# Patient Record
Sex: Female | Born: 1970 | Race: Black or African American | Hispanic: No | Smoking: Current every day smoker
Health system: Southern US, Community
[De-identification: ages and names within clinical notes are randomized; demographics above are authoritative.]

## PROBLEM LIST (undated history)

## (undated) DIAGNOSIS — G459 Transient cerebral ischemic attack, unspecified: Secondary | ICD-10-CM

## (undated) DIAGNOSIS — R7303 Prediabetes: Secondary | ICD-10-CM

## (undated) DIAGNOSIS — I10 Essential (primary) hypertension: Secondary | ICD-10-CM

## (undated) DIAGNOSIS — G43909 Migraine, unspecified, not intractable, without status migrainosus: Secondary | ICD-10-CM

## (undated) DIAGNOSIS — M797 Fibromyalgia: Secondary | ICD-10-CM

## (undated) HISTORY — PX: ABDOMINAL HYSTERECTOMY: SHX81

## (undated) HISTORY — DX: Prediabetes: R73.03

---

## 2003-10-15 ENCOUNTER — Other Ambulatory Visit: Payer: Self-pay

## 2006-07-26 ENCOUNTER — Emergency Department: Payer: Self-pay | Admitting: Emergency Medicine

## 2007-01-21 ENCOUNTER — Emergency Department: Payer: Self-pay | Admitting: Emergency Medicine

## 2012-07-21 ENCOUNTER — Emergency Department: Payer: Self-pay | Admitting: Unknown Physician Specialty

## 2013-04-05 ENCOUNTER — Emergency Department: Payer: Self-pay | Admitting: Unknown Physician Specialty

## 2013-05-12 ENCOUNTER — Emergency Department: Payer: Self-pay | Admitting: Emergency Medicine

## 2013-10-25 ENCOUNTER — Emergency Department: Payer: Self-pay | Admitting: Emergency Medicine

## 2013-10-25 LAB — CBC
HCT: 43.8 % (ref 35.0–47.0)
MCHC: 33.3 g/dL (ref 32.0–36.0)
RBC: 4.97 10*6/uL (ref 3.80–5.20)
WBC: 12.5 10*3/uL — ABNORMAL HIGH (ref 3.6–11.0)

## 2013-10-25 LAB — BASIC METABOLIC PANEL
Anion Gap: 2 — ABNORMAL LOW (ref 7–16)
Chloride: 108 mmol/L — ABNORMAL HIGH (ref 98–107)
EGFR (African American): >60
EGFR (Non-African Amer.): >60
Glucose: 110 mg/dL — ABNORMAL HIGH (ref 65–99)
Osmolality: 276 (ref 275–301)
Sodium: 138 mmol/L (ref 136–145)

## 2013-10-25 LAB — TROPONIN I: Troponin-I: <0.02 ng/mL

## 2013-10-26 LAB — TROPONIN I: Troponin-I: <0.02 ng/mL

## 2013-12-04 ENCOUNTER — Emergency Department: Payer: Self-pay | Admitting: Emergency Medicine

## 2014-07-12 ENCOUNTER — Emergency Department: Payer: Self-pay | Admitting: Emergency Medicine

## 2014-08-18 ENCOUNTER — Ambulatory Visit: Payer: Self-pay

## 2014-08-26 ENCOUNTER — Emergency Department: Payer: Self-pay | Admitting: Internal Medicine

## 2014-08-30 ENCOUNTER — Ambulatory Visit: Payer: Self-pay

## 2014-12-31 ENCOUNTER — Emergency Department: Payer: Self-pay | Admitting: Emergency Medicine

## 2015-02-11 ENCOUNTER — Observation Stay
Admit: 2015-02-11 | Disposition: A | Discharge status: Home / Self Care | Payer: Self-pay | Attending: Internal Medicine | Admitting: Internal Medicine

## 2015-02-11 DIAGNOSIS — G459 Transient cerebral ischemic attack, unspecified: Secondary | ICD-10-CM

## 2015-02-11 LAB — CBC WITH DIFFERENTIAL/PLATELET
Basophil #: 0.1 10*3/uL (ref 0.0–0.1)
Basophil %: 0.8 %
EOS PCT: 2.2 %
Eosinophil #: 0.3 10*3/uL (ref 0.0–0.7)
HCT: 44.1 % (ref 35.0–47.0)
HGB: 14.4 g/dL (ref 12.0–16.0)
Lymphocyte #: 5.1 10*3/uL — ABNORMAL HIGH (ref 1.0–3.6)
Lymphocyte %: 44 %
MCH: 29 pg (ref 26.0–34.0)
MCHC: 32.7 g/dL (ref 32.0–36.0)
MCV: 89 fL (ref 80–100)
MONO ABS: 1.1 x10 3/mm — AB (ref 0.2–0.9)
Monocyte %: 9.5 %
NEUTROS PCT: 43.5 %
Neutrophil #: 5.1 10*3/uL (ref 1.4–6.5)
PLATELETS: 212 10*3/uL (ref 150–440)
RBC: 4.99 10*6/uL (ref 3.80–5.20)
RDW: 13.5 % (ref 11.5–14.5)
WBC: 11.6 10*3/uL — ABNORMAL HIGH (ref 3.6–11.0)

## 2015-02-11 LAB — COMPREHENSIVE METABOLIC PANEL
ALBUMIN: 3.8 g/dL
ALK PHOS: 63 U/L
ALT: 18 U/L
Anion Gap: 2 — ABNORMAL LOW (ref 7–16)
BUN: 13 mg/dL
Bilirubin,Total: 0.4 mg/dL
CHLORIDE: 112 mmol/L — AB
Calcium, Total: 9 mg/dL
Co2: 24 mmol/L
Creatinine: 0.76 mg/dL
EGFR (African American): >60
EGFR (Non-African Amer.): >60
GLUCOSE: 99 mg/dL
Potassium: 4.3 mmol/L
SGOT(AST): 19 U/L
SODIUM: 138 mmol/L
TOTAL PROTEIN: 7.4 g/dL

## 2015-02-27 NOTE — Discharge Summary (Signed)
PATIENT NAME:  Samantha Guerra, Samantha Guerra MR#:  [id number] DATE OF BIRTH:  07/18/1971  DATE OF ADMISSION:  02/11/2015 DATE OF DISCHARGE:  02/11/2015  DISCHARGE DIAGNOSES: 1. Right upper extremity tingling, likely neuropathy.  2. Fibromyalgia.  3. Depression.  4. Hypertension.   DISCHARGE MEDICATIONS: 1. Gabapentin 300 mg 2 tablets oral 3 times a day.  2. Trazodone 100 mg oral once a day.  3. Omeprazole 20 mg daily.  4. Aspirin 81 mg daily.  5. Citalopram 20 mg daily.  6. Topamax 25 mg daily.  7. Hydrochlorothiazide 25 mg daily.  8. Potassium chloride 10 mEq extended release daily.   DISCHARGE INSTRUCTIONS: Low sodium, low fat, low cholesterol diet. Activity as tolerated. Follow up with primary care physician in 1-2 weeks and MRI of the brain and neck to be ordered by her PCP.   ADMITTING HISTORY AND PHYSICAL: Please see detailed H and P dictated by Dr. Betti Cruzeddy. In brief, a 44 year old moderately obese African American female patient, brought into the hospital complaining of right upper extremity tingling. The patient was admitted to rule out acute stroke. The patient had a CT of the head which was normal. The patient refused to get an MRI due to her anxiety. She has failed getting an MRI in the past due to sedation and did not want to get it. This has been canceled.  Her symptoms seem more of a  neuropathy and not stroke. Symptoms are much improved. I have advised her to get an MRI of the brain and neck and an open MRI ordered by her PCP as outpatient.   The patient did have new diagnosis of hypertension, was started on hydrochlorothiazide along with potassium supplements and she will follow up with her PCP.   Prior to discharge, the patient's lungs sound clear. S1, S2 heard. Motor strength 5 out of 5 in upper extremities and sensations to fine touch intact all over.   TIME SPENT ON DAY OF DISCHARGE IN DISCHARGE ACTIVITY: Was 35 minutes.    ____________________________ Molinda BailiffSrikar R. Eduin Friedel,  MD srs:tr D: 02/16/2015 13:50:55 ET T: 02/16/2015 17:10:12 ET JOB#: [id number]  cc: Wardell HeathSrikar R. Mckenzee Beem, MD, <Dictator> Orie FishermanSRIKAR R Taheem Fricke MD ELECTRONICALLY SIGNED 02/21/2015 11:21

## 2015-02-27 NOTE — H&P (Signed)
PATIENT NAME:  Samantha Guerra, Samantha Guerra MR#:  [id number] DATE OF BIRTH:  20-Oct-1971  DATE OF ADMISSION:  02/11/2015  REFERRING DOCTOR: Marjean Donna, MD  PRIMARY CARE PRACTITIONER: [facility]   ADMITTING PHYSICIAN: Azucena Freed, MD  CHIEF COMPLAINT: Dizziness associated with some blurred vision and right arm numbness.   HISTORY OF PRESENT ILLNESS: A 44 year old African American female with a history of depression, fibromyalgia, and migraine headaches who presents to the Emergency Room with the complaints of dizziness associated with some blurred vision and right arm numbness which started sometime yesterday. Since the symptoms continued she came to the Emergency Room for further evaluation. In the Emergency Room, the patient was evaluated by the ED physician and was found to be with stable vitals but was noted to have right upper extremity mild weakness. The patient underwent work-up which included normal labs and a CT of the head, noncontrast study, was negative for any acute intracranial pathology. In view of her symptoms, concerning for TIA, rule out CVA, hospitalist service was consulted for further management. The patient is comfortably resting in the bed at this time. Denies any dizziness or visual symptoms at this time. She does have some mild numbness of the right upper extremity, but denies any weakness. Denies any history of chest pain, shortness of breath, palpitations. No fever or cough. No nausea, vomiting, diarrhea. No dysuria, frequency, or urgency. No prior such symptoms.   PAST MEDICAL HISTORY: 1.  Fibromyalgia.  2.  Depression.  3.  Migraine headaches.   PAST SURGICAL HISTORY: Partial hysterectomy.   ALLERGIES: No known drug allergies.   FAMILY HISTORY: Mom with history of hypertension, CVA, throat cancer and father with history of cancer.   SOCIAL HISTORY: She is divorced, unemployed. History of smoking, 1 pack per day. Denies any alcohol, substance abuse.   HOME MEDICATIONS:   1.  Aspirin 81 mg 1 tablet orally once a day. 2.  Citalopram 20 mg 1 tablet orally once a day.  3.  Gabapentin 300 mg 2 capsules orally 3 times a day.  4.  Omeprazole 20 mg 1 capsule orally once a day.  5.  Topamax 25 mg 1 tablet orally 2 times a day.  6.  Trazodone 100 mg 2 tablets orally at bedtime.   REVIEW OF SYSTEMS: CONSTITUTIONAL: Negative for fever, chills, fatigue, or generalized weakness.  EYES: Positive for blurred vision, as noted in history of present illness. Denies any pain, redness, discharge.  ENT: Negative for tinnitus, ear pain, hearing loss, epistaxis, or nasal discharge.  RESPIRATORY: Negative for cough, wheezing, dyspnea, hemoptysis, or painful respiration.  CARDIOVASCULAR: Negative for chest pain, palpitations, syncopal episodes, orthopnea, dyspnea on exertion, or pedal edema. Positive for dizziness, as noted in the history of present illness.  GASTROINTESTINAL: Negative for nausea, vomiting, diarrhea, constipation, abdominal pain, hematemesis, melena or GERD symptoms.   GENITOURINARY: Negative for dysuria, frequency, urgency or hematuria. ENDOCRINE: Negative for polyuria, nocturia, heat or cold intolerance.  HEMATOLOGIC AND LYMPHATIC: Negative for anemia, easy bruising or bleeding.  INTEGUMENT: Negative for acne, skin rash, or lesions.  MUSCULOSKELETAL: History of fibromyalgia, stable on medications.  NEUROLOGICAL: Positive for dizziness and blurred vision with associated right upper extremity numbness. No history of CVA, TIA, or seizure disorder in the past.  PSYCHIATRIC: Positive for history of depression, stable on medications.   PHYSICAL EXAMINATION: VITAL SIGNS: Temperature 98.3 degrees Fahrenheit, pulse rate 76 per minute, respirations 18 per minute, blood pressure on arrival 170/108, current blood pressure 140/82, O2 saturation 100% on  room air. GENERAL: Well-developed, well-nourished, alert, comfortable, resting in bed, in no acute distress.  HEAD:  Atraumatic, normocephalic.  EYES: Pupils are equal, reactive to light and accommodation. No conjunctival pallor. No icterus. Extraocular movements intact.  NOSE: No drainage.  EARS: No drainage.  ORAL CAVITY: No mucosal lesions.  NECK: Supple. No JVD. No thyromegaly. No carotid bruit. Range of motion of neck within normal limits.  RESPIRATORY: Good respiratory effort. Not using accessory muscles of respiration. Bilateral vesicular breath sounds. No rales or rhonchi.  CARDIOVASCULAR: S1, S2 regular. No murmurs, gallops or clicks. Pulses equal at carotid, femoral, and pedal pulses. No peripheral edema.  GASTROINTESTINAL: Abdomen is soft, nontender. No hepatosplenomegaly. No masses. No rigidity. No guarding. Bowel sounds present and equal in all 4 quadrants.  GENITOURINARY: Deferred.  MUSCULOSKELETAL: No joint tenderness or effusion. Range of motion is adequate. Strength and tone equal bilaterally.  SKIN: Inspection within normal limits.  LYMPHATIC: No cervical lymphadenopathy.  VASCULAR: Good dorsalis pedis and posterior tibial pulses.  NEUROLOGIC: Alert, awake, and oriented x3. Cranial nerves II through XII grossly intact. No sensory deficit. Motor strength 5/5 in both upper and lower extremities. DTRs 2+ bilateral and symmetrical. Plantars downgoing.  PSYCHIATRIC: Alert, awake, and oriented x3. Judgment and insight are adequate. Memory and mood within normal limits.   DIAGNOSTIC DATA: Serum glucose 99, BUN 13, creatinine 0.76, sodium 138, potassium 4.3, chloride 112, bicarb 24, total calcium 9, total protein 7.4, albumin 3.8, total bilirubin 0.4, alk phos 63, AST 19, ALT 18. WBC 11.6, hemoglobin 14.4, hematocrit 44.1, platelet count 212,000.   CT of the head, noncontrast study: Unremarkable head CT with no acute intracranial processes.   EKG: Normal sinus rhythm with ventricular rate of 69 beats per minute.   ASSESSMENT AND PLAN: A 44 year old [place] American female with a history of  depression, fibromyalgia, tobacco usage, and migraine headaches presents with complaints of dizziness with associated right arm numbness/weakness and blurred vision, concerning for transient ischemic attack. 1.  Dizziness with blurred vision and right arm numbness/weakness. Transient ischemic attack, rule out cerebrovascular accident. Plan: Admit to telemetry for observation, aspirin, neuro watch, request echocardiogram, carotid Doppler, MRI brain and neurology consultation for further advice. 2.  Depression. Stable on home medications. Continue same.  3.  Fibromyalgia. Stable on home medications. Continue same.  4.  Tobacco usage. Counseled to quit. Offered nicotine replacement treatment. The patient is not motivated to quit. 5.  Deep venous thrombosis prophylaxis. Subcutaneous Lovenox.  6.  Gastrointestinal prophylaxis. Proton pump inhibitor.   CODE STATUS: FULL.  TIME SPENT: 50 minutes.  ____________________________ Juluis Mire, MD enr:sb D: 02/11/2015 07:28:46 ET T: 02/11/2015 07:54:15 ET JOB#: [id number]  cc: Juluis Mire, MD, <Dictator> [facility] - PCP Juluis Mire MD ELECTRONICALLY SIGNED 02/11/2015 19:24

## 2015-10-06 ENCOUNTER — Emergency Department
Admission: EM | Admit: 2015-10-06 | Discharge: 2015-10-06 | Disposition: A | Discharge status: Home / Self Care | Payer: Self-pay | Attending: Emergency Medicine | Admitting: Emergency Medicine

## 2015-10-06 ENCOUNTER — Encounter: Payer: Self-pay | Admitting: *Deleted

## 2015-10-06 DIAGNOSIS — F17200 Nicotine dependence, unspecified, uncomplicated: Secondary | ICD-10-CM | POA: Insufficient documentation

## 2015-10-06 DIAGNOSIS — M25562 Pain in left knee: Secondary | ICD-10-CM | POA: Insufficient documentation

## 2015-10-06 DIAGNOSIS — Z7982 Long term (current) use of aspirin: Secondary | ICD-10-CM | POA: Insufficient documentation

## 2015-10-06 DIAGNOSIS — Z79899 Other long term (current) drug therapy: Secondary | ICD-10-CM | POA: Insufficient documentation

## 2015-10-06 DIAGNOSIS — I10 Essential (primary) hypertension: Secondary | ICD-10-CM | POA: Insufficient documentation

## 2015-10-06 HISTORY — DX: Essential (primary) hypertension: I10

## 2015-10-06 HISTORY — DX: Fibromyalgia: M79.7

## 2015-10-06 HISTORY — DX: Migraine, unspecified, not intractable, without status migrainosus: G43.909

## 2015-10-06 MED ORDER — NAPROXEN 500 MG PO TBEC: 500.0000 mg | DELAYED_RELEASE_TABLET | Freq: Two times a day (BID) | ORAL | Status: DC

## 2015-10-06 NOTE — ED Provider Notes (Signed)
[facility] Emergency Department Provider Note ____________________________________________  Time seen: 0905  I have reviewed the triage vital signs and the nursing notes.  HISTORY  Chief Complaint  Knee Pain  HPI Samantha Guerra is a 44 y.o. female reports to the ED for evaluation of left knee pain that seems to be worse in the last 3 months. She scribes pain across the anterior portion of the knee and burning to the posterior aspect of the knee. She does admit to increased pain and swelling with prolonged sitting. She also notes occasional "pop" after prolonged sitting. She has not been evaluated for this symptom by her primary care provider. She does also not feel like this pain is typical of her fibromyalgia. She denies any history of prior knee problems, or recent injury, trauma, or fall. She reports the discomfort to her knee at 8/10 in triage.  Past Medical History  Diagnosis Date  . Hypertension   . Fibromyalgia   . Migraine    There are no active problems to display for this patient.   Past Surgical History  Procedure Laterality Date  . Abdominal hysterectomy      Current Outpatient Rx  Name  Route  Sig  Dispense  Refill  . aspirin 81 MG tablet   Oral   Take 81 mg by mouth daily.         Marland Kitchen gabapentin (NEURONTIN) 600 MG tablet   Oral   Take 600 mg by mouth 3 (three) times daily.         . hydrochlorothiazide (HYDRODIURIL) 25 MG tablet   Oral   Take 25 mg by mouth daily.         Marland Kitchen topiramate (TOPAMAX) 25 MG tablet   Oral   Take 25 mg by mouth 2 (two) times daily.         . trazodone (DESYREL) 300 MG tablet   Oral   Take 300 mg by mouth at bedtime.         Marland Kitchen zolpidem (AMBIEN) 5 MG tablet   Oral   Take 5 mg by mouth at bedtime as needed for sleep.         . naproxen (EC NAPROSYN) 500 MG EC tablet   Oral   Take 1 tablet (500 mg total) by mouth 2 (two) times daily with a meal.   30 tablet   0    Allergies Review of  patient's allergies indicates no known allergies.  History reviewed. No pertinent family history.  Social History Social History  Substance Use Topics  . Smoking status: Current Every Day Smoker  . Smokeless tobacco: None  . Alcohol Use: No   Review of Systems  Constitutional: Negative for fever. Eyes: Negative for visual changes. ENT: Negative for sore throat. Cardiovascular: Negative for chest pain. Respiratory: Negative for shortness of breath. Gastrointestinal: Negative for abdominal pain, vomiting and diarrhea. Genitourinary: Negative for dysuria. Musculoskeletal: Negative for back pain. Left knee pain as above. Skin: Negative for rash. Neurological: Negative for headaches, focal weakness or numbness. ____________________________________________  PHYSICAL EXAM:  VITAL SIGNS: ED Triage Vitals  Enc Vitals Group     BP 10/06/15 0809 129/92 mmHg     Pulse Rate 10/06/15 0809 74     Resp 10/06/15 0809 18     Temp 10/06/15 0809 98.4 F (36.9 C)     Temp Source 10/06/15 0809 Oral     SpO2 10/06/15 0809 96 %     Weight 10/06/15 0809 222  lb (100.699 kg)     Height 10/06/15 0809 5\' 7"  (1.702 m)     Head Cir --      Peak Flow --      Pain Score 10/06/15 0810 8     Pain Loc --      Pain Edu? --      Excl. in GC? --    Constitutional: Alert and oriented. Well appearing and in no distress. Head: Normocephalic and atraumatic.      Eyes: Conjunctivae are normal. PERRL. Normal extraocular movements      Ears: Canals clear. TMs intact bilaterally.   Nose: No congestion/rhinorrhea.   Mouth/Throat: Mucous membranes are moist.   Neck: Supple. No thyromegaly. Hematological/Lymphatic/Immunological: No cervical lymphadenopathy. Cardiovascular: Normal rate, regular rhythm.  Respiratory: Normal respiratory effort. No wheezes/rales/rhonchi. Gastrointestinal: Soft and nontender. No distention. Musculoskeletal: Left knee without obvious deformity, effusion, or swelling.  Patient with normal patellar tracking on exam. Normal full knee extension and flexion on exam. No valgus or varus stress stresses appreciated. There is no popliteal space fullness, no calf or Achilles tenderness. Nontender with normal range of motion in all extremities.  Neurologic:  Normal gait without ataxia. Normal speech and language. No gross focal neurologic deficits are appreciated. Skin:  Skin is warm, dry and intact. No rash noted. Psychiatric: Mood and affect are normal. Patient exhibits appropriate insight and judgment. ____________________________________________   RADIOLOGY deferred ____________________________________________  INITIAL IMPRESSION / ASSESSMENT AND PLAN / ED COURSE  Left knee pain without known underlying cause or any evidence of internal derangement. X-ray imaging is deferred today following presentation. Patient is encouraged to continue to dose her knee sleeve for support and comfort. She is provided with a prescription for EC-Naprosyn and a dose as needed for pain and inflammation. She is referred to Dr. Kennedy BuckerMichael Menz for ongoing symptoms. ____________________________________________  FINAL CLINICAL IMPRESSION(S) / ED DIAGNOSES  Final diagnoses:  Left knee pain      Lissa HoardJenise V Bacon Laray Rivkin, PA-C 10/06/15 1639  Jeanmarie PlantJames A McShane, MD 10/07/15 1440

## 2015-10-06 NOTE — Discharge Instructions (Signed)
Knee Pain Knee pain is a very common symptom and can have many causes. Knee pain often goes away when you follow your health care provider's instructions for relieving pain and discomfort at home. However, knee pain can develop into a condition that needs treatment. Some conditions may include:  Arthritis caused by wear and tear (osteoarthritis).  Arthritis caused by swelling and irritation (rheumatoid arthritis or gout).  A cyst or growth in your knee.  An infection in your knee joint.  An injury that will not heal.  Damage, swelling, or irritation of the tissues that support your knee (torn ligaments or tendinitis). If your knee pain continues, additional tests may be ordered to diagnose your condition. Tests may include X-rays or other imaging studies of your knee. You may also need to have fluid removed from your knee. Treatment for ongoing knee pain depends on the cause, but treatment may include:  Medicines to relieve pain or swelling.  Steroid injections in your knee.  Physical therapy.  Surgery. HOME CARE INSTRUCTIONS  Take medicines only as directed by your health care provider.  Rest your knee and keep it raised (elevated) while you are resting.  Do not do things that cause or worsen pain.  Avoid high-impact activities or exercises, such as running, jumping rope, or doing jumping jacks.  Apply ice to the knee area:  Put ice in a plastic bag.  Place a towel between your skin and the bag.  Leave the ice on for 20 minutes, 2-3 times a day.  Ask your health care provider if you should wear an elastic knee support.  Keep a pillow under your knee when you sleep.  Lose weight if you are overweight. Extra weight can put pressure on your knee.  Do not use any tobacco products, including cigarettes, chewing tobacco, or electronic cigarettes. If you need help quitting, ask your health care provider. Smoking may slow the healing of any bone and joint problems that you may  have. SEEK MEDICAL CARE IF:  Your knee pain continues, changes, or gets worse.  You have a fever along with knee pain.  Your knee buckles or locks up.  Your knee becomes more swollen. SEEK IMMEDIATE MEDICAL CARE IF:   Your knee joint feels hot to the touch.  You have chest pain or trouble breathing.   This information is not intended to replace advice given to you by your health care provider. Make sure you discuss any questions you have with your health care provider.   Document Released: 08/12/2007 Document Revised: 11/05/2014 Document Reviewed: 05/31/2014 Elsevier Interactive Patient Education 2016 [employer]  Continue to monitor your symptoms. Follow-up with Dr. Rosita KeaMenz for ongoing knee pain. Take the Naprosyn as directed. Wear your knee brace as needed for support.

## 2015-10-06 NOTE — ED Notes (Signed)
Developed pain to left knee about 2-3 months ago. Denies any injury . Has occasional swelling to left knee but describes pain as burning to lateral and post knee

## 2015-10-06 NOTE — ED Notes (Signed)
Pt states left knee pain, denies any injury, states she has had knee pain for about 2 months

## 2016-05-21 ENCOUNTER — Ambulatory Visit: Payer: Self-pay | Attending: Internal Medicine

## 2016-05-21 ENCOUNTER — Ambulatory Visit
Admission: RE | Admit: 2016-05-21 | Discharge: 2016-05-21 | Disposition: A | Discharge status: Home / Self Care | Payer: Self-pay | Source: Ambulatory Visit | Attending: Internal Medicine | Admitting: Internal Medicine

## 2016-05-21 VITALS — BP 144/98 | HR 77 | Temp 98.5°F | Ht 68.9 in | Wt 240.3 lb

## 2016-05-21 DIAGNOSIS — Z0000 Encounter for general adult medical examination without abnormal findings: Secondary | ICD-10-CM

## 2016-05-21 NOTE — Progress Notes (Signed)
Patient ID: Samantha Guerra, female   DOB: January 13, 1971, 45 y.o.   MRN: [id number]   45 year old patient presents for [facility].  Patient screened, and meets B[facility]CP eligibility.  Patient does not have insurance, Medicare or Medicaid.  Handout given on Affordable Care Act. Instructed patient on breast self-exam using teach back method.  CBE unremarkable.  Patient states she is prescribed HCTZ, but has not taken for two weeks.  States she has medication, just forgets to take.  Reviewed seriousness of sustained hypertension.  Recheck of blood pressure improved.  Patient to take medication when she gets home, and at same time every day as prescribed.

## 2016-05-21 NOTE — Progress Notes (Signed)
Subjective:     Patient ID: Samantha Guerra, female   DOB: 07-21-1971, 45 y.o.   MRN: [id number]  HPI   Review of Systems     Objective:   Physical Exam  Pulmonary/Chest: Right breast exhibits no inverted nipple, no mass, no nipple discharge, no skin change and no tenderness. Left breast exhibits no inverted nipple, no mass, no nipple discharge, no skin change and no tenderness. Breasts are symmetrical.       Assessment:     See progress note    Plan:     Sent for bilateral screening mammogram.

## 2016-05-21 NOTE — Progress Notes (Signed)
Letter mailed from [facility] to notify of normal mammogram results.  Patient to return in one year for annual screening.  Copy to [facility]. 

## 2016-10-22 ENCOUNTER — Encounter: Payer: Self-pay | Admitting: Emergency Medicine

## 2016-10-22 ENCOUNTER — Emergency Department: Payer: Self-pay

## 2016-10-22 ENCOUNTER — Emergency Department
Admission: EM | Admit: 2016-10-22 | Discharge: 2016-10-22 | Disposition: A | Discharge status: Home / Self Care | Payer: Self-pay | Attending: Emergency Medicine | Admitting: Emergency Medicine

## 2016-10-22 DIAGNOSIS — Z7982 Long term (current) use of aspirin: Secondary | ICD-10-CM | POA: Insufficient documentation

## 2016-10-22 DIAGNOSIS — I10 Essential (primary) hypertension: Secondary | ICD-10-CM | POA: Insufficient documentation

## 2016-10-22 DIAGNOSIS — Z79899 Other long term (current) drug therapy: Secondary | ICD-10-CM | POA: Insufficient documentation

## 2016-10-22 DIAGNOSIS — F17200 Nicotine dependence, unspecified, uncomplicated: Secondary | ICD-10-CM | POA: Insufficient documentation

## 2016-10-22 DIAGNOSIS — M25561 Pain in right knee: Secondary | ICD-10-CM | POA: Insufficient documentation

## 2016-10-22 MED ORDER — NAPROXEN 500 MG PO TABS
500.0000 mg | ORAL_TABLET | Freq: Once | ORAL | Status: AC
  Administered 2016-10-22: 500 mg via ORAL
  Filled 2016-10-22: qty 1

## 2016-10-22 MED ORDER — NAPROXEN 500 MG PO TABS: 500.0000 mg | ORAL_TABLET | Freq: Two times a day (BID) | ORAL | 0 refills | Status: DC

## 2016-10-22 NOTE — ED Triage Notes (Signed)
Pt presents with right knee pain x 2 days. States it has worsened and it is now difficult to walk on it. Pt denies injury or trauma. NAD noted.

## 2016-10-22 NOTE — ED Provider Notes (Signed)
[facility] Emergency Department Provider Note  ____________________________________________  Time seen: Approximately 2:30 PM  I have reviewed the triage vital signs and the nursing notes.   HISTORY  Chief Complaint Knee Pain    HPI Samantha Guerra is a 45 y.o. female , NAD, presents to the emergency department with 2 day history of right knee pain. Patient states she has history of fibromyalgia but has had onset of right knee pain that feels different over the last 2 days. States she got out of the shower earlier today that it felt as if her knee was going to give out. Denies any injuries, traumas or falls. Feels that the knee is more swollen than the left but has not had any redness or abnormal warmth. Denies any pain above or below the knee. Has had not had any numbness, weakness, tingling.   Past Medical History:  Diagnosis Date  . Fibromyalgia   . Hypertension   . Migraine     There are no active problems to display for this patient.   Past Surgical History:  Procedure Laterality Date  . ABDOMINAL HYSTERECTOMY      Prior to Admission medications   Medication Sig Start Date End Date Taking? Authorizing Provider  aspirin 81 MG tablet Take 81 mg by mouth daily.    Historical Provider, MD  gabapentin (NEURONTIN) 600 MG tablet Take 600 mg by mouth 3 (three) times daily.    Historical Provider, MD  hydrochlorothiazide (HYDRODIURIL) 25 MG tablet Take 25 mg by mouth daily.    Historical Provider, MD  naproxen (NAPROSYN) 500 MG tablet Take 1 tablet (500 mg total) by mouth 2 (two) times daily with a meal. 10/22/16   Regina Coppolino L Mirelle Biskup, PA-C  topiramate (TOPAMAX) 25 MG tablet Take 25 mg by mouth 2 (two) times daily.    Historical Provider, MD  trazodone (DESYREL) 300 MG tablet Take 300 mg by mouth at bedtime.    Historical Provider, MD  zolpidem (AMBIEN) 5 MG tablet Take 5 mg by mouth at bedtime as needed for sleep.    Historical Provider, MD     Allergies Patient has no known allergies.  Family History  Problem Relation Age of Onset  . Breast cancer Neg Hx     Social History Social History  Substance Use Topics  . Smoking status: Current Every Day Smoker    Packs/day: 1.50  . Smokeless tobacco: Never Used  . Alcohol use No     Review of Systems  Constitutional: No fever/chills Musculoskeletal: Positive right knee pain. Negative for right hip, thigh, lower leg, including for pain. No back pain.  Skin: Swelling right knee. Negative for rash, redness, warmth, skin sores. Neurological: Negative for numbness, weakness, tingling.  ____________________________________________   PHYSICAL EXAM:  VITAL SIGNS: ED Triage Vitals [10/22/16 1422]  Enc Vitals Group     BP (!) 154/87     Pulse Rate 75     Resp 18     Temp 98.2 F (36.8 C)     Temp Source Oral     SpO2 99 %     Weight 215 lb (97.5 kg)     Height 5\' 7"  (1.702 m)     Head Circumference      Peak Flow      Pain Score 9     Pain Loc      Pain Edu?      Excl. in GC?      Constitutional: Alert and oriented. Well  appearing and in no acute distress. Eyes: Conjunctivae are normal.  Head: Atraumatic. Cardiovascular: Good peripheral circulation With 2+ pulses noted in the right lower extremity. Respiratory: Normal respiratory effort without tachypnea or retractions.  Musculoskeletal: Full range of motion of the right knee but pain with full flexion. No laxity with anterior or posterior drawer. No laxity with varus and valgus stress. Negative patellofemoral grind. Mild crepitus is noted with movement of the patella. No lower extremity tenderness nor edema.  No joint effusions. Neurologic:  Normal speech and language. No gross focal neurologic deficits are appreciated.  Skin:  Skin is warm, dry and intact. No rash, redness, swelling, abnormal warmth, skin sores noted. Psychiatric: Mood and affect are normal. Speech and behavior are normal. Patient exhibits  appropriate insight and judgement.   ____________________________________________   LABS  None ____________________________________________  EKG  None ____________________________________________  RADIOLOGY I, Hope PigeonJami L Emma-Lee Oddo, personally viewed and evaluated these images (plain radiographs) as part of my medical decision making, as well as reviewing the written report by the radiologist.  Dg Knee Complete 4 Views Right  Result Date: 10/22/2016 CLINICAL DATA:  Anterior right knee pain for 2 days. No known injury. EXAM: RIGHT KNEE - COMPLETE 4+ VIEW COMPARISON:  None. FINDINGS: No evidence of fracture, dislocation, or joint effusion. No evidence of arthropathy or other focal bone abnormality. Soft tissues are unremarkable. IMPRESSION: Negative exam. Electronically Signed   By: Drusilla Kannerhomas  Dalessio M.D.   On: 10/22/2016 15:07    ____________________________________________    PROCEDURES  Procedure(s) performed: None   Procedures   Medications  naproxen (NAPROSYN) tablet 500 mg (not administered)     ____________________________________________   INITIAL IMPRESSION / ASSESSMENT AND PLAN / ED COURSE  Pertinent labs & imaging results that were available during my care of the patient were reviewed by me and considered in my medical decision making (see chart for details).  Clinical Course     Patient's diagnosis is consistent with Acute right knee pain. Patient was given a tablet of Naprosyn while in the emergency department. His right knee placed in an Ace wrap and given crutches for supportive care. Patient will be discharged home with prescriptions for naproxen to take as directed. Patient is to follow up with Dr. Rosita KeaMenz in orthopedics in 1 week if symptoms persist past this treatment course. Patient is given ED precautions to return to the ED for any worsening or new symptoms.    ____________________________________________  FINAL CLINICAL IMPRESSION(S) / ED  DIAGNOSES  Final diagnoses:  Acute pain of right knee      NEW MEDICATIONS STARTED DURING THIS VISIT:  New Prescriptions   NAPROXEN (NAPROSYN) 500 MG TABLET    Take 1 tablet (500 mg total) by mouth 2 (two) times daily with a meal.         Hope PigeonJami L Jashawn Floyd, PA-C 10/22/16 1519    Jennye MoccasinBrian S Quigley, MD 10/22/16 [phone]

## 2016-12-01 ENCOUNTER — Encounter: Payer: Self-pay | Admitting: Emergency Medicine

## 2016-12-01 DIAGNOSIS — F17210 Nicotine dependence, cigarettes, uncomplicated: Secondary | ICD-10-CM | POA: Insufficient documentation

## 2016-12-01 DIAGNOSIS — K529 Noninfective gastroenteritis and colitis, unspecified: Secondary | ICD-10-CM | POA: Insufficient documentation

## 2016-12-01 DIAGNOSIS — Z7982 Long term (current) use of aspirin: Secondary | ICD-10-CM | POA: Insufficient documentation

## 2016-12-01 DIAGNOSIS — I10 Essential (primary) hypertension: Secondary | ICD-10-CM | POA: Insufficient documentation

## 2016-12-01 DIAGNOSIS — Z79899 Other long term (current) drug therapy: Secondary | ICD-10-CM | POA: Insufficient documentation

## 2016-12-01 MED ORDER — ONDANSETRON 4 MG PO TBDP
4.0000 mg | ORAL_TABLET | Freq: Once | ORAL | Status: AC | PRN
Start: 2016-12-01 — End: 2016-12-01
  Administered 2016-12-01: 4 mg via ORAL
  Filled 2016-12-01: qty 1

## 2016-12-01 NOTE — ED Notes (Signed)
Assisted pt in lobby bathroom; pt stated "feel like I'm gonna pass out." Pt on toilet with diarrhea and vomiting. Assisted pt into wheelchair and brought to triage 2.

## 2016-12-01 NOTE — ED Triage Notes (Signed)
Pt presents to ED with c/o frequent vomiting and diarrhea since this afternoon. Pt most recent v/d was in lobby restroom. Pt had reportedly felt like she was going to pass out. Slight fever in triage.

## 2016-12-02 ENCOUNTER — Emergency Department
Admission: EM | Admit: 2016-12-02 | Discharge: 2016-12-02 | Disposition: A | Discharge status: Home / Self Care | Payer: Self-pay | Attending: Emergency Medicine | Admitting: Emergency Medicine

## 2016-12-02 DIAGNOSIS — K529 Noninfective gastroenteritis and colitis, unspecified: Secondary | ICD-10-CM

## 2016-12-02 MED ORDER — ONDANSETRON 4 MG PO TBDP: ORAL_TABLET | ORAL | 0 refills | Status: DC

## 2016-12-02 MED ORDER — ONDANSETRON 4 MG PO TBDP
4.0000 mg | ORAL_TABLET | Freq: Once | ORAL | Status: AC
  Administered 2016-12-02: 4 mg via ORAL
  Filled 2016-12-02: qty 1

## 2016-12-02 NOTE — ED Notes (Signed)
Pt able to keep Sprite down without an episode of vomiting.

## 2016-12-02 NOTE — ED Notes (Signed)
Pt c/o of nausea, vomiting and diarrhea since 1300 on 12/01/16. Pt denies shortness of breath, vision changes or any blood in the stool or emesis.  Pt is able to speak in complete sentences and does not appear to be in distress at this time. Pt is alert and oriented x4.

## 2016-12-02 NOTE — Discharge Instructions (Signed)

## 2016-12-02 NOTE — ED Notes (Signed)
Pt given some Sprite to drink as PO challenge

## 2016-12-02 NOTE — ED Provider Notes (Signed)
[facility] Emergency Department Provider Note  ____________________________________________   First MD Initiated Contact with Patient 12/02/16 0148     (approximate)  I have reviewed the triage vital signs and the nursing notes.   HISTORY  Chief Complaint Emesis and Diarrhea    HPI Samantha Guerra is a 46 y.o. female who presents for evaluation of acute onset nausea, vomiting, and diarrhea.  She states that she felt "weird" all day but became acutely ill in the early afternoon when she fell she had to run to the bathroom to vomit.  She states that very soon she was sitting on the toilet having a loose watery diarrhea while vomiting into a bucket.  She had several similar episodes throughout the afternoon.  At no point did she have any abdominal pain, just some mild cramping associated with the diarrhea.  Her vomiting and diarrhea were severe and nothing was making it better or worse.  She denies fever/chills, chest pain, shortness of breath, dysuria.  She has had no blood in her emesis or stool.  She received some Zofran and triage and states that she feels much better now and was able to get some sleep.   Past Medical History:  Diagnosis Date  . Fibromyalgia   . Hypertension   . Migraine     There are no active problems to display for this patient.   Past Surgical History:  Procedure Laterality Date  . ABDOMINAL HYSTERECTOMY      Prior to Admission medications   Medication Sig Start Date End Date Taking? Authorizing Provider  aspirin 81 MG tablet Take 81 mg by mouth daily.    Historical Provider, MD  gabapentin (NEURONTIN) 600 MG tablet Take 600 mg by mouth 3 (three) times daily.    Historical Provider, MD  hydrochlorothiazide (HYDRODIURIL) 25 MG tablet Take 25 mg by mouth daily.    Historical Provider, MD  naproxen (NAPROSYN) 500 MG tablet Take 1 tablet (500 mg total) by mouth 2 (two) times daily with a meal. 10/22/16   Jami L Hagler, PA-C    ondansetron (ZOFRAN ODT) 4 MG disintegrating tablet Allow 1-2 tablets to dissolve in your mouth every 8 hours as needed for nausea/vomiting 12/02/16   Loleta Rose, MD  topiramate (TOPAMAX) 25 MG tablet Take 25 mg by mouth 2 (two) times daily.    Historical Provider, MD  trazodone (DESYREL) 300 MG tablet Take 300 mg by mouth at bedtime.    Historical Provider, MD  zolpidem (AMBIEN) 5 MG tablet Take 5 mg by mouth at bedtime as needed for sleep.    Historical Provider, MD    Allergies Patient has no known allergies.  Family History  Problem Relation Age of Onset  . Breast cancer Neg Hx     Social History Social History  Substance Use Topics  . Smoking status: Current Every Day Smoker    Packs/day: 1.00    Types: Cigarettes  . Smokeless tobacco: Never Used  . Alcohol use No    Review of Systems Constitutional: No fever/chills Eyes: No visual changes. ENT: No sore throat. Cardiovascular: Denies chest pain. Respiratory: Denies shortness of breath. Gastrointestinal: Vomiting and diarrhea for multiple episodes since earlier this afternoon Genitourinary: Negative for dysuria. Musculoskeletal: Negative for back pain. Skin: Negative for rash. Neurological: Negative for headaches, focal weakness or numbness.  10-point ROS otherwise negative.  ____________________________________________   PHYSICAL EXAM:  VITAL SIGNS: ED Triage Vitals [12/01/16 2217]  Enc Vitals Group  BP (!) 142/91     Pulse Rate (!) 112     Resp 18     Temp 100 F (37.8 C)     Temp Source Oral     SpO2 96 %     Weight 228 lb (103.4 kg)     Height 5\' 7"  (1.702 m)     Head Circumference      Peak Flow      Pain Score 5     Pain Loc      Pain Edu?      Excl. in GC?     Constitutional: Alert and oriented. Well appearing and in no acute distress. Eyes: Conjunctivae are normal. PERRL. EOMI. Head: Atraumatic. Nose: No congestion/rhinnorhea. Mouth/Throat: Mucous membranes are moist.  Oropharynx  non-erythematous. Neck: No stridor.  No meningeal signs.   Cardiovascular: Normal rate, regular rhythm. Good peripheral circulation. Grossly normal heart sounds. Respiratory: Normal respiratory effort.  No retractions. Lungs CTAB. Gastrointestinal: Soft and nontender. No distention.  Musculoskeletal: No lower extremity tenderness nor edema. No gross deformities of extremities. Neurologic:  Normal speech and language. No gross focal neurologic deficits are appreciated.  Skin:  Skin is warm, dry and intact. No rash noted. Psychiatric: Mood and affect are normal. Speech and behavior are normal.  ____________________________________________   LABS (all labs ordered are listed, but only abnormal results are displayed)  Labs Reviewed - No data to display ____________________________________________  EKG  None - EKG not ordered by ED physician ____________________________________________  RADIOLOGY   No results found.  ____________________________________________   PROCEDURES  Procedure(s) performed:   Procedures   Critical Care performed: No ____________________________________________   INITIAL IMPRESSION / ASSESSMENT AND PLAN / ED COURSE  Pertinent labs & imaging results that were available during my care of the patient were reviewed by me and considered in my medical decision making (see chart for details).   Clinical Course as of Dec 03 747  Wynelle Link Dec 02, 2016  0203 The patient is resting comfortably and states that she feels much better after the Zofran out front.  She has been sleeping without any distress.  She has not had any abdominal pain and has a reassuring exam with no tenderness to palpation.  The initial tachycardia seen at triage has resolved.  I will give another 4 mg of Zofran ODT and provide [place] for by mouth challenge.  Her signs and symptoms are very consistent with a viral gastroenteritis.  There is no indication for blood work at this time as it  is unlikely to be illustrative or change the plan.  I had a this discussion with the patient and she agrees with the plan for outpatient follow-up and a prescription for Zofran.I gave my usual and customary return precautions.   [CF]    Clinical Course User Index [CF] Loleta Rose, MD    ____________________________________________  FINAL CLINICAL IMPRESSION(S) / ED DIAGNOSES  Final diagnoses:  Gastroenteritis     MEDICATIONS GIVEN DURING THIS VISIT:  Medications  ondansetron (ZOFRAN-ODT) disintegrating tablet 4 mg (4 mg Oral Given 12/01/16 2232)  ondansetron (ZOFRAN-ODT) disintegrating tablet 4 mg (4 mg Oral Given 12/02/16 0209)     NEW OUTPATIENT MEDICATIONS STARTED DURING THIS VISIT:  Discharge Medication List as of 12/02/2016  2:07 AM    START taking these medications   Details  ondansetron (ZOFRAN ODT) 4 MG disintegrating tablet Allow 1-2 tablets to dissolve in your mouth every 8 hours as needed for nausea/vomiting, Print  Discharge Medication List as of 12/02/2016  2:07 AM      Discharge Medication List as of 12/02/2016  2:07 AM       Note:  This document was prepared using Dragon voice recognition software and may include unintentional dictation errors.    Loleta Roseory Lashaun Krapf, MD 12/02/16 [phone]

## 2016-12-02 NOTE — ED Notes (Signed)

## 2017-01-04 ENCOUNTER — Emergency Department: Payer: Self-pay

## 2017-01-04 ENCOUNTER — Encounter: Payer: Self-pay | Admitting: Emergency Medicine

## 2017-01-04 DIAGNOSIS — I10 Essential (primary) hypertension: Secondary | ICD-10-CM | POA: Insufficient documentation

## 2017-01-04 DIAGNOSIS — M25561 Pain in right knee: Secondary | ICD-10-CM | POA: Insufficient documentation

## 2017-01-04 DIAGNOSIS — Z79899 Other long term (current) drug therapy: Secondary | ICD-10-CM | POA: Insufficient documentation

## 2017-01-04 DIAGNOSIS — G8929 Other chronic pain: Secondary | ICD-10-CM | POA: Insufficient documentation

## 2017-01-04 DIAGNOSIS — F17210 Nicotine dependence, cigarettes, uncomplicated: Secondary | ICD-10-CM | POA: Insufficient documentation

## 2017-01-04 DIAGNOSIS — Z7982 Long term (current) use of aspirin: Secondary | ICD-10-CM | POA: Insufficient documentation

## 2017-01-04 NOTE — ED Triage Notes (Signed)
Pt ambulatory to triage with limping gait noted. Pt offered wheelchair but refused. Pt reports pain to her right knee. Pt reports she has had pain in her right knee for over a year. Has been seen here and by her PCP. Today the pain is worse. Pt denies any injury. CMS intact.

## 2017-01-05 ENCOUNTER — Emergency Department
Admission: EM | Admit: 2017-01-05 | Discharge: 2017-01-05 | Disposition: A | Discharge status: Home / Self Care | Payer: Self-pay | Attending: Emergency Medicine | Admitting: Emergency Medicine

## 2017-01-05 DIAGNOSIS — M25561 Pain in right knee: Secondary | ICD-10-CM

## 2017-01-05 DIAGNOSIS — G8929 Other chronic pain: Secondary | ICD-10-CM

## 2017-01-05 MED ORDER — OXYCODONE-ACETAMINOPHEN 5-325 MG PO TABS
1.0000 | ORAL_TABLET | Freq: Once | ORAL | Status: AC
  Administered 2017-01-05: 1 via ORAL
  Filled 2017-01-05: qty 1

## 2017-01-05 MED ORDER — IBUPROFEN 800 MG PO TABS
800.0000 mg | ORAL_TABLET | Freq: Once | ORAL | Status: AC
  Administered 2017-01-05: 800 mg via ORAL
  Filled 2017-01-05: qty 1

## 2017-01-05 MED ORDER — IBUPROFEN 800 MG PO TABS: 800.0000 mg | ORAL_TABLET | Freq: Three times a day (TID) | ORAL | 0 refills | Status: DC | PRN

## 2017-01-05 MED ORDER — OXYCODONE-ACETAMINOPHEN 5-325 MG PO TABS: 1.0000 | ORAL_TABLET | ORAL | 0 refills | Status: DC | PRN

## 2017-01-05 NOTE — ED Provider Notes (Signed)
[facility] Emergency Department Provider Note   ____________________________________________   First MD Initiated Contact with Patient 01/05/17 [phone]     (approximate)  I have reviewed the triage vital signs and the nursing notes.   HISTORY  Chief Complaint Knee Pain    HPI Samantha Guerra is a 46 y.o. female presents to the ED from home with a chief complaint of right knee pain. Patient reports chronic right knee pain for one year; does not recall initial trauma, fall or injury. Although she has been seen for same in the ED and PCP, she has not had orthopedics follow-up. Today the pain is worse without aggravating injury or trauma. Complains of anterior knee pain and swelling. Denies associated fever, chills, chest pain, shortness of breath, abdominal pain, vomiting, diarrhea. Denies recent travel or trauma. Eyes hormone use. Nothing makes her symptoms better; movement makes her symptoms worse.   Past Medical History:  Diagnosis Date  . Fibromyalgia   . Hypertension   . Migraine     There are no active problems to display for this patient.   Past Surgical History:  Procedure Laterality Date  . ABDOMINAL HYSTERECTOMY      Prior to Admission medications   Medication Sig Start Date End Date Taking? Authorizing Provider  aspirin 81 MG tablet Take 81 mg by mouth daily.    Historical Provider, MD  gabapentin (NEURONTIN) 600 MG tablet Take 600 mg by mouth 3 (three) times daily.    Historical Provider, MD  hydrochlorothiazide (HYDRODIURIL) 25 MG tablet Take 25 mg by mouth daily.    Historical Provider, MD  ibuprofen (ADVIL,MOTRIN) 800 MG tablet Take 1 tablet (800 mg total) by mouth every 8 (eight) hours as needed for moderate pain. 01/05/17   Irean HongJade J Lachrisha Ziebarth, MD  naproxen (NAPROSYN) 500 MG tablet Take 1 tablet (500 mg total) by mouth 2 (two) times daily with a meal. 10/22/16   Jami L Hagler, PA-C  ondansetron (ZOFRAN ODT) 4 MG disintegrating tablet Allow 1-2  tablets to dissolve in your mouth every 8 hours as needed for nausea/vomiting 12/02/16   Loleta Roseory Forbach, MD  oxyCODONE-acetaminophen (ROXICET) 5-325 MG tablet Take 1 tablet by mouth every 4 (four) hours as needed for severe pain. 01/05/17   Irean HongJade J Evin Chirco, MD  topiramate (TOPAMAX) 25 MG tablet Take 25 mg by mouth 2 (two) times daily.    Historical Provider, MD  trazodone (DESYREL) 300 MG tablet Take 300 mg by mouth at bedtime.    Historical Provider, MD  zolpidem (AMBIEN) 5 MG tablet Take 5 mg by mouth at bedtime as needed for sleep.    Historical Provider, MD    Allergies Patient has no known allergies.  Family History  Problem Relation Age of Onset  . Breast cancer Neg Hx     Social History Social History  Substance Use Topics  . Smoking status: Current Every Day Smoker    Packs/day: 1.00    Types: Cigarettes  . Smokeless tobacco: Never Used  . Alcohol use No    Review of Systems  Constitutional: No fever/chills. Eyes: No visual changes. ENT: No sore throat. Cardiovascular: Denies chest pain. Respiratory: Denies shortness of breath. Gastrointestinal: No abdominal pain.  No nausea, no vomiting.  No diarrhea.  No constipation. Genitourinary: Negative for dysuria. Musculoskeletal: Positive for right knee pain. Negative for back pain. Skin: Negative for rash. Neurological: Negative for headaches, focal weakness or numbness.  10-point ROS otherwise negative.  ____________________________________________   PHYSICAL EXAM:  VITAL SIGNS: ED Triage Vitals  Enc Vitals Group     BP 01/04/17 2345 139/88     Pulse Rate 01/04/17 2345 80     Resp 01/04/17 2345 18     Temp 01/04/17 2345 97.9 F (36.6 C)     Temp Source 01/04/17 2345 Oral     SpO2 01/04/17 2345 98 %     Weight 01/04/17 2339 215 lb (97.5 kg)     Height 01/04/17 2339 5\' 7"  (1.702 m)     Head Circumference --      Peak Flow --      Pain Score 01/04/17 2339 8     Pain Loc --      Pain Edu? --      Excl. in GC? --       Constitutional: Alert and oriented. Well appearing and in no acute distress. Eyes: Conjunctivae are normal. PERRL. EOMI. Head: Atraumatic. Nose: No congestion/rhinnorhea. Mouth/Throat: Mucous membranes are moist.  Oropharynx non-erythematous. Neck: No stridor.   Cardiovascular: Normal rate, regular rhythm. Grossly normal heart sounds.  Good peripheral circulation. Respiratory: Normal respiratory effort.  No retractions. Lungs CTAB. Gastrointestinal: Soft and nontender. No distention. No abdominal bruits. No CVA tenderness. Musculoskeletal: Right lateral knee with mild effusion. Tender to palpation anterior knee; limited range of motion secondary to pain. Right lower leg mildly swollen compared to left; patient states this is chronic. Calf is supple without evidence for compartment syndrome. No Homans sign. 2+ distal pulses. Brisk, less than 5 second capillary refill. Symmetrically warm limb without evidence for ischemia.  Neurologic:  Normal speech and language. No gross focal neurologic deficits are appreciated. Limping gait. Skin:  Skin is warm, dry and intact. No rash noted. Psychiatric: Mood and affect are normal. Speech and behavior are normal.  ____________________________________________   LABS (all labs ordered are listed, but only abnormal results are displayed)  Labs Reviewed - No data to display ____________________________________________  EKG  None ____________________________________________  RADIOLOGY  Right knee x-ray interpreted per Dr. Clovis Riley: Negative ____________________________________________   PROCEDURES  Procedure(s) performed: None  Procedures  Critical Care performed: No  ____________________________________________   INITIAL IMPRESSION / ASSESSMENT AND PLAN / ED COURSE  Pertinent labs & imaging results that were available during my care of the patient were reviewed by me and considered in my medical decision making (see chart for  details).  46 year old female who presents with nontraumatic acute on chronic right knee pain, worsened today. Patient has knee brace at home. Will discharge home with prescriptions for NSAIDs, analgesia and orthopedics follow-up. Strict return precautions given. Patient verbalizes understanding and agrees with plan of care.      ____________________________________________   FINAL CLINICAL IMPRESSION(S) / ED DIAGNOSES  Final diagnoses:  Chronic pain of right knee      NEW MEDICATIONS STARTED DURING THIS VISIT:  New Prescriptions   IBUPROFEN (ADVIL,MOTRIN) 800 MG TABLET    Take 1 tablet (800 mg total) by mouth every 8 (eight) hours as needed for moderate pain.   OXYCODONE-ACETAMINOPHEN (ROXICET) 5-325 MG TABLET    Take 1 tablet by mouth every 4 (four) hours as needed for severe pain.     Note:  This document was prepared using Dragon voice recognition software and may include unintentional dictation errors.    Irean Hong, MD 01/05/17 [phone]

## 2017-01-05 NOTE — Discharge Instructions (Signed)
1. You may take pain medicines as needed (Motrin/Percocet #15). 2. Elevate affected area and apply ice several times daily. 3. Wear your knee brace as needed for comfort. 4. Return to the ER for worsening symptoms, persistent vomiting, difficulty breathing or other concerns.

## 2017-01-19 ENCOUNTER — Emergency Department: Payer: Self-pay

## 2017-01-19 ENCOUNTER — Encounter: Payer: Self-pay | Admitting: Emergency Medicine

## 2017-01-19 ENCOUNTER — Emergency Department
Admission: EM | Admit: 2017-01-19 | Discharge: 2017-01-20 | Disposition: A | Discharge status: Home / Self Care | Payer: Self-pay | Attending: Emergency Medicine | Admitting: Emergency Medicine

## 2017-01-19 DIAGNOSIS — B9789 Other viral agents as the cause of diseases classified elsewhere: Secondary | ICD-10-CM

## 2017-01-19 DIAGNOSIS — I10 Essential (primary) hypertension: Secondary | ICD-10-CM | POA: Insufficient documentation

## 2017-01-19 DIAGNOSIS — Z7982 Long term (current) use of aspirin: Secondary | ICD-10-CM | POA: Insufficient documentation

## 2017-01-19 DIAGNOSIS — R0789 Other chest pain: Secondary | ICD-10-CM | POA: Insufficient documentation

## 2017-01-19 DIAGNOSIS — Z79899 Other long term (current) drug therapy: Secondary | ICD-10-CM | POA: Insufficient documentation

## 2017-01-19 DIAGNOSIS — F17210 Nicotine dependence, cigarettes, uncomplicated: Secondary | ICD-10-CM | POA: Insufficient documentation

## 2017-01-19 DIAGNOSIS — J069 Acute upper respiratory infection, unspecified: Secondary | ICD-10-CM | POA: Insufficient documentation

## 2017-01-19 LAB — BASIC METABOLIC PANEL
ANION GAP: 4 — AB (ref 5–15)
BUN: 15 mg/dL (ref 6–20)
CO2: 25 mmol/L (ref 22–32)
Calcium: 8.8 mg/dL — ABNORMAL LOW (ref 8.9–10.3)
Chloride: 105 mmol/L (ref 101–111)
Creatinine, Ser: 0.92 mg/dL (ref 0.44–1.00)
GFR calc non Af Amer: >60 mL/min (ref >60)
Glucose, Bld: 101 mg/dL — ABNORMAL HIGH (ref 65–99)
POTASSIUM: 3.9 mmol/L (ref 3.5–5.1)
Sodium: 134 mmol/L — ABNORMAL LOW (ref 135–145)

## 2017-01-19 LAB — CBC
HEMATOCRIT: 41.5 % (ref 35.0–47.0)
HEMOGLOBIN: 14.1 g/dL (ref 12.0–16.0)
MCH: 29.7 pg (ref 26.0–34.0)
MCHC: 34 g/dL (ref 32.0–36.0)
MCV: 87.3 fL (ref 80.0–100.0)
Platelets: 191 10*3/uL (ref 150–440)
RBC: 4.75 MIL/uL (ref 3.80–5.20)
RDW: 14 % (ref 11.5–14.5)
WBC: 11.9 10*3/uL — ABNORMAL HIGH (ref 3.6–11.0)

## 2017-01-19 LAB — TROPONIN I: Troponin I: <0.03 ng/mL (ref <0.03)

## 2017-01-19 MED ORDER — HYDROCOD POLST-CPM POLST ER 10-8 MG/5ML PO SUER
5.0000 mL | Freq: Once | ORAL | Status: AC
  Administered 2017-01-20: 5 mL via ORAL
  Filled 2017-01-19: qty 5

## 2017-01-19 MED ORDER — MAGIC MOUTHWASH
10.0000 mL | Freq: Once | ORAL | Status: AC
  Administered 2017-01-20: 10 mL via ORAL
  Filled 2017-01-19: qty 10

## 2017-01-19 MED ORDER — KETOROLAC TROMETHAMINE 30 MG/ML IJ SOLN: 60.0000 mg | Freq: Once | INTRAMUSCULAR | Status: DC

## 2017-01-19 NOTE — ED Triage Notes (Signed)
Patient with complaint of cough that started on Thursday. Patient reports chest pressure that started on Friday.

## 2017-01-19 NOTE — ED Provider Notes (Signed)
[facility] Emergency Department Provider Note   ____________________________________________   First MD Initiated Contact with Patient 01/19/17 2325     (approximate)  I have reviewed the triage vital signs and the nursing notes.   HISTORY  Chief Complaint Cough and Chest Pain    HPI Samantha Guerra is a 46 y.o. female who presents to the ED from home with a chief complaint of cough, sore throat, chest tightness. Patient reports a one-day history of cough productive of green sputum, sore throat, chills, chest discomfort especially on coughing. Denies shortness of breath, abdominal pain, nausea, vomiting, diarrhea. Denies recent travel or trauma. Nothing makes her symptoms better. Coughing makes her chest hurt worse.   Past Medical History:  Diagnosis Date  . Fibromyalgia   . Hypertension   . Migraine     There are no active problems to display for this patient.   Past Surgical History:  Procedure Laterality Date  . ABDOMINAL HYSTERECTOMY      Prior to Admission medications   Medication Sig Start Date End Date Taking? Authorizing Provider  aspirin 81 MG tablet Take 81 mg by mouth daily.    Historical Provider, MD  gabapentin (NEURONTIN) 600 MG tablet Take 600 mg by mouth 3 (three) times daily.    Historical Provider, MD  hydrochlorothiazide (HYDRODIURIL) 25 MG tablet Take 25 mg by mouth daily.    Historical Provider, MD  ibuprofen (ADVIL,MOTRIN) 800 MG tablet Take 1 tablet (800 mg total) by mouth every 8 (eight) hours as needed for moderate pain. 01/05/17   Irean HongJade J Sung, MD  naproxen (NAPROSYN) 500 MG tablet Take 1 tablet (500 mg total) by mouth 2 (two) times daily with a meal. 10/22/16   Jami L Hagler, PA-C  ondansetron (ZOFRAN ODT) 4 MG disintegrating tablet Allow 1-2 tablets to dissolve in your mouth every 8 hours as needed for nausea/vomiting 12/02/16   Loleta Roseory Forbach, MD  oxyCODONE-acetaminophen (ROXICET) 5-325 MG tablet Take 1 tablet by mouth  every 4 (four) hours as needed for severe pain. 01/05/17   Irean HongJade J Sung, MD  topiramate (TOPAMAX) 25 MG tablet Take 25 mg by mouth 2 (two) times daily.    Historical Provider, MD  trazodone (DESYREL) 300 MG tablet Take 300 mg by mouth at bedtime.    Historical Provider, MD  zolpidem (AMBIEN) 5 MG tablet Take 5 mg by mouth at bedtime as needed for sleep.    Historical Provider, MD    Allergies Patient has no known allergies.  Family History  Problem Relation Age of Onset  . Breast cancer Neg Hx     Social History Social History  Substance Use Topics  . Smoking status: Current Every Day Smoker    Packs/day: 1.00    Types: Cigarettes  . Smokeless tobacco: Never Used  . Alcohol use No    Review of Systems  Constitutional: No fever/chills. Eyes: No visual changes. ENT: Positive for sore throat. Cardiovascular: Positive for chest pain. Respiratory: Positive for productive cough. Denies shortness of breath. Gastrointestinal: No abdominal pain.  No nausea, no vomiting.  No diarrhea.  No constipation. Genitourinary: Negative for dysuria. Musculoskeletal: Negative for back pain. Skin: Negative for rash. Neurological: Negative for headaches, focal weakness or numbness.  10-point ROS otherwise negative.  ____________________________________________   PHYSICAL EXAM:  VITAL SIGNS: ED Triage Vitals  Enc Vitals Group     BP 01/19/17 2321 (!) 159/99     Pulse Rate 01/19/17 2321 84     Resp  01/19/17 2321 18     Temp 01/19/17 2321 98 F (36.7 C)     Temp Source 01/19/17 2321 Oral     SpO2 01/19/17 2321 99 %     Weight 01/19/17 2319 215 lb (97.5 kg)     Height 01/19/17 2319 5\' 7"  (1.702 m)     Head Circumference --      Peak Flow --      Pain Score 01/19/17 2319 7     Pain Loc --      Pain Edu? --      Excl. in GC? --     Constitutional: Alert and oriented. Well appearing and in no acute distress. Eyes: Conjunctivae are normal. PERRL. EOMI. Head: Atraumatic. Nose: No  congestion/rhinnorhea. Mouth/Throat: Mucous membranes are moist.  Oropharynx moderately erythematous without tonsillar swelling, exudates or peritonsillar abscess. Voice is hoarse. There is no muffled voice or drooling. Neck: No stridor.   Cardiovascular: Normal rate, regular rhythm. Grossly normal heart sounds.  Good peripheral circulation. Respiratory: Normal respiratory effort.  No retractions. Lungs CTAB. Anterior chest tender to palpation. Gastrointestinal: Soft and nontender. No distention. No abdominal bruits. No CVA tenderness. Musculoskeletal: No lower extremity tenderness nor edema.  No joint effusions. Neurologic:  Normal speech and language. No gross focal neurologic deficits are appreciated. No gait instability. Skin:  Skin is warm, dry and intact. No rash noted. Psychiatric: Mood and affect are normal. Speech and behavior are normal.  ____________________________________________   LABS (all labs ordered are listed, but only abnormal results are displayed)  Labs Reviewed  BASIC METABOLIC PANEL - Abnormal; Notable for the following:       Result Value   Sodium 134 (*)    Glucose, Bld 101 (*)    Calcium 8.8 (*)    Anion gap 4 (*)    All other components within normal limits  CBC - Abnormal; Notable for the following:    WBC 11.9 (*)    All other components within normal limits  TROPONIN I  POCT RAPID STREP A   ____________________________________________  EKG  ED ECG REPORT I, SUNG,JADE J, the attending physician, personally viewed and interpreted this ECG.   Date: 01/20/2017  EKG Time: 2317  Rate: 87  Rhythm: normal EKG, normal sinus rhythm  Axis: Normal  Intervals:none  ST&T Change: Nonspecific  ____________________________________________  RADIOLOGY  Chest x-ray (viewed by me, interpreted per Dr. Gwenyth Bender): No active cardiopulmonary disease. ____________________________________________   PROCEDURES  Procedure(s) performed:  None  Procedures  Critical Care performed: No  ____________________________________________   INITIAL IMPRESSION / ASSESSMENT AND PLAN / ED COURSE  Pertinent labs & imaging results that were available during my care of the patient were reviewed by me and considered in my medical decision making (see chart for details).  46 year old female who presents with bronchitis symptoms. EKG and troponin are negative. There is no `pneumonia on chest x-ray. Will treat with NSAID's, Tussionex for cough, Magic mouthwash. Will obtain rapid strep.  Clinical Course as of Jan 21 140  Sun Jan 20, 2017  0140 Updated patient of negative strep results. She is feeling better after medications. Strict return precautions given. Patient verbalizes understanding and agrees with plan of care.  [JS]  0140 I reviewed the patient's prescription history over the last 12 months in the multi-state controlled substances database(s) that includes [place], [place], [place], [place], [place], [place], [place], [place], [place], [place], [place], [place], [place], and [place].  The patient has filled no controlled substances  during that time.   [JS]    Clinical Course User Index [JS] Irean Hong, MD     ____________________________________________   FINAL CLINICAL IMPRESSION(S) / ED DIAGNOSES  Final diagnoses:  Viral URI with cough      NEW MEDICATIONS STARTED DURING THIS VISIT:  New Prescriptions   No medications on file     Note:  This document was prepared using Dragon voice recognition software and may include unintentional dictation errors.    Irean Hong, MD 01/20/17 [phone]

## 2017-01-19 NOTE — ED Notes (Signed)
Patient reports she took one 81 mg aspirin this am; patient has not taken BP medication today

## 2017-01-19 NOTE — ED Notes (Signed)
Patient transported to X-ray 

## 2017-01-20 LAB — POCT RAPID STREP A: STREPTOCOCCUS, GROUP A SCREEN (DIRECT): NEGATIVE

## 2017-01-20 MED ORDER — KETOROLAC TROMETHAMINE 30 MG/ML IJ SOLN
INTRAMUSCULAR | Status: AC
  Filled 2017-01-20: qty 1

## 2017-01-20 MED ORDER — HYDROCOD POLST-CPM POLST ER 10-8 MG/5ML PO SUER: 5.0000 mL | Freq: Two times a day (BID) | ORAL | 0 refills | Status: DC

## 2017-01-20 MED ORDER — MAGIC MOUTHWASH: 10.0000 mL | Freq: Three times a day (TID) | ORAL | 0 refills | Status: DC | PRN

## 2017-01-20 MED ORDER — KETOROLAC TROMETHAMINE 30 MG/ML IJ SOLN
10.0000 mg | Freq: Once | INTRAMUSCULAR | Status: AC
Start: 2017-01-20 — End: 2017-01-20
  Administered 2017-01-20: 9.9 mg via INTRAVENOUS

## 2017-01-20 NOTE — ED Notes (Signed)
Reviewed d/c instructions, follow-up care, prescriptions with patient. Pt verbalized understanding.  

## 2017-01-20 NOTE — Discharge Instructions (Signed)
1.  You may take Tussionex as needed for cough. °2.  You may take Magic mouthwash as needed for throat discomfort. °3.  Return to the ER for worsening symptoms, persistent vomiting, difficulty breathing or other concerns. °

## 2017-04-16 ENCOUNTER — Emergency Department
Admission: EM | Admit: 2017-04-16 | Discharge: 2017-04-16 | Disposition: A | Discharge status: Home / Self Care | Payer: Self-pay | Attending: Emergency Medicine | Admitting: Emergency Medicine

## 2017-04-16 ENCOUNTER — Emergency Department: Payer: Self-pay

## 2017-04-16 DIAGNOSIS — M797 Fibromyalgia: Secondary | ICD-10-CM | POA: Insufficient documentation

## 2017-04-16 DIAGNOSIS — I10 Essential (primary) hypertension: Secondary | ICD-10-CM | POA: Insufficient documentation

## 2017-04-16 DIAGNOSIS — Z7982 Long term (current) use of aspirin: Secondary | ICD-10-CM | POA: Insufficient documentation

## 2017-04-16 DIAGNOSIS — N390 Urinary tract infection, site not specified: Secondary | ICD-10-CM | POA: Insufficient documentation

## 2017-04-16 DIAGNOSIS — R079 Chest pain, unspecified: Secondary | ICD-10-CM | POA: Insufficient documentation

## 2017-04-16 DIAGNOSIS — F17210 Nicotine dependence, cigarettes, uncomplicated: Secondary | ICD-10-CM | POA: Insufficient documentation

## 2017-04-16 LAB — CBC
HCT: 41.7 % (ref 35.0–47.0)
Hemoglobin: 14.4 g/dL (ref 12.0–16.0)
MCH: 30.9 pg (ref 26.0–34.0)
MCHC: 34.6 g/dL (ref 32.0–36.0)
MCV: 89.3 fL (ref 80.0–100.0)
PLATELETS: 222 10*3/uL (ref 150–440)
RBC: 4.67 MIL/uL (ref 3.80–5.20)
RDW: 14.2 % (ref 11.5–14.5)
WBC: 11.7 10*3/uL — AB (ref 3.6–11.0)

## 2017-04-16 LAB — TROPONIN I

## 2017-04-16 LAB — BASIC METABOLIC PANEL
Anion gap: 5 (ref 5–15)
BUN: 12 mg/dL (ref 6–20)
CALCIUM: 8.6 mg/dL — AB (ref 8.9–10.3)
CO2: 23 mmol/L (ref 22–32)
Chloride: 107 mmol/L (ref 101–111)
Creatinine, Ser: 0.75 mg/dL (ref 0.44–1.00)
GFR calc non Af Amer: >60 mL/min (ref >60)
Glucose, Bld: 105 mg/dL — ABNORMAL HIGH (ref 65–99)
Potassium: 3.5 mmol/L (ref 3.5–5.1)
SODIUM: 135 mmol/L (ref 135–145)

## 2017-04-16 LAB — URINALYSIS, ROUTINE W REFLEX MICROSCOPIC
BILIRUBIN URINE: NEGATIVE
Glucose, UA: NEGATIVE mg/dL
HGB URINE DIPSTICK: NEGATIVE
Ketones, ur: NEGATIVE mg/dL
NITRITE: NEGATIVE
PH: 6 (ref 5.0–8.0)
Protein, ur: NEGATIVE mg/dL
SPECIFIC GRAVITY, URINE: 1.032 — AB (ref 1.005–1.030)

## 2017-04-16 MED ORDER — FOSFOMYCIN TROMETHAMINE 3 G PO PACK
3.0000 g | PACK | Freq: Once | ORAL | Status: AC
  Administered 2017-04-16: 3 g via ORAL
  Filled 2017-04-16: qty 3

## 2017-04-16 NOTE — ED Notes (Signed)
ED Provider at bedside. 

## 2017-04-16 NOTE — ED Triage Notes (Signed)
Pt c/o substernal chest pain that started last night, feels like a pressure type pain.

## 2017-04-16 NOTE — ED Provider Notes (Signed)
[facility] Emergency Department Provider Note  Time seen: 3:50 PM  I have reviewed the triage vital signs and the nursing notes.   HISTORY  Chief Complaint Chest Pain    HPI Samantha Guerra is a 46 y.o. female with a past medical history fibromyalgia, hypertension, migraines, presents to department for chest discomfort. According to the patient last night she developed central to left-sided chest pain with some radiation to her left arm. Denies any nausea or diaphoresis. States mild shortness of breath which has resolved. Patient states the pain went away last night however today she began feeling it again so she came to the emergency department for evaluation. Denies any cardiac history but does state a family history of cardiac disease. Does have some mild pedal edema for which she takes a fluid pill per patient. States very slight discomfort currently denies any shortness of breath nausea or diaphoresis currently. Denies any abdominal pain. Largely negative review of systems.  Past Medical History:  Diagnosis Date  . Fibromyalgia   . Hypertension   . Migraine     There are no active problems to display for this patient.   Past Surgical History:  Procedure Laterality Date  . ABDOMINAL HYSTERECTOMY      Prior to Admission medications   Medication Sig Start Date End Date Taking? Authorizing Provider  aspirin 81 MG tablet Take 81 mg by mouth daily.    [provider]  chlorpheniramine-HYDROcodone (TUSSIONEX PENNKINETIC ER) 10-8 MG/5ML SUER Take 5 mLs by mouth 2 (two) times daily. 01/20/17   Irean Hong, MD  gabapentin (NEURONTIN) 600 MG tablet Take 600 mg by mouth 3 (three) times daily.    [provider]  hydrochlorothiazide (HYDRODIURIL) 25 MG tablet Take 25 mg by mouth daily.    [provider]  ibuprofen (ADVIL,MOTRIN) 800 MG tablet Take 1 tablet (800 mg total) by mouth every 8 (eight) hours as needed for moderate pain. 01/05/17    Irean Hong, MD  magic mouthwash SOLN Take 10 mLs by mouth 3 (three) times daily as needed for mouth pain. 01/20/17   Irean Hong, MD  naproxen (NAPROSYN) 500 MG tablet Take 1 tablet (500 mg total) by mouth 2 (two) times daily with a meal. 10/22/16   Hagler, Jami L, PA-C  ondansetron (ZOFRAN ODT) 4 MG disintegrating tablet Allow 1-2 tablets to dissolve in your mouth every 8 hours as needed for nausea/vomiting 12/02/16   Loleta Rose, MD  oxyCODONE-acetaminophen (ROXICET) 5-325 MG tablet Take 1 tablet by mouth every 4 (four) hours as needed for severe pain. 01/05/17   Irean Hong, MD  topiramate (TOPAMAX) 25 MG tablet Take 25 mg by mouth 2 (two) times daily.    [provider]  trazodone (DESYREL) 300 MG tablet Take 300 mg by mouth at bedtime.    [provider]  zolpidem (AMBIEN) 5 MG tablet Take 5 mg by mouth at bedtime as needed for sleep.    [provider]    No Known Allergies  Family History  Problem Relation Age of Onset  . Breast cancer Neg Hx     Social History Social History  Substance Use Topics  . Smoking status: Current Every Day Smoker    Packs/day: 1.00    Types: Cigarettes  . Smokeless tobacco: Never Used  . Alcohol use No    Review of Systems Constitutional: Negative for fever. ENT: Negative for congestion Cardiovascular: Mild central to left-sided chest pain. Respiratory: Mild  shortness breath, resolved. Gastrointestinal: Negative for abdominal pain, vomiting and diarrhea. Genitourinary: Negative for dysuria. Positive for dark urine. Musculoskeletal: Negative for back pain. Mild pedal edema which is chronic. No increased edema, no calf pain. Skin: Negative for rash. Neurological: Negative for headache All other ROS negative  ____________________________________________   PHYSICAL EXAM:  VITAL SIGNS: ED Triage Vitals  Enc Vitals Group     BP 04/16/17 1511 (!) 151/93     Pulse Rate 04/16/17 1511 71     Resp 04/16/17 1511 20      Temp 04/16/17 1511 98.5 F (36.9 C)     Temp Source 04/16/17 1511 Oral     SpO2 04/16/17 1511 100 %     Weight 04/16/17 1509 213 lb (96.6 kg)     Height 04/16/17 1509 5\' 7"  (1.702 m)     Head Circumference --      Peak Flow --      Pain Score 04/16/17 1508 7     Pain Loc --      Pain Edu? --      Excl. in GC? --     Constitutional: Alert and oriented. Well appearing and in no distress. Eyes: Normal exam ENT   Head: Normocephalic and atraumatic   Mouth/Throat: Mucous membranes are moist. Cardiovascular: Normal rate, regular rhythm. No murmur Respiratory: Normal respiratory effort without tachypnea nor retractions. Breath sounds are clear and equal bilaterally. No wheezes/rales/rhonchi. Gastrointestinal: Soft and nontender. No distention.   Musculoskeletal: Nontender with normal range of motion in all extremities. No lower extremity tenderness. No edema appreciated. Neurologic:  Normal speech and language. No gross focal neurologic deficits  Skin:  Skin is warm, dry and intact.  Psychiatric: Mood and affect are normal.   ____________________________________________    EKG  EKG reviewed and interpreted by myself shows normal sinus rhythm at 77 bpm, narrow QRS, normal axis, normal intervals, no concerning ST changes.  ____________________________________________    RADIOLOGY  Chest x-ray negative  ____________________________________________   INITIAL IMPRESSION / ASSESSMENT AND PLAN / ED COURSE  Pertinent labs & imaging results that were available during my care of the patient were reviewed by me and considered in my medical decision making (see chart for details).  Patient presents the emergency department for chest pain since yesterday. Patient's chest x-rays negative. EKG is reassuring. We will check labs including cardiac enzymes. Patient did state on review of systems some dark urine we'll obtain a urinalysis. Overall the patient appears well. States very  slight to no discomfort currently. States she had a niece recently had a heart attack which is what concerned her over her symptoms today.  Patient's labs are largely within normal limits including a negative troponin. Patient's urinalysis does show 6-30 white blood cells we will cover with a one-time dose of fosfomycin. Urine culture has been sent. Given the patient's chest discomfort and as she is over 89 years of age and believe the patient is safe for discharge home with a cardiology follow-up appointment for a stress test. The patient is agreeable to this plan.  ____________________________________________   FINAL CLINICAL IMPRESSION(S) / ED DIAGNOSES  Chest pain UTI   Minna AntisPaduchowski, Zaylyn Bergdoll, MD 04/16/17 1612

## 2017-04-17 LAB — URINE CULTURE

## 2017-06-25 ENCOUNTER — Other Ambulatory Visit: Payer: Self-pay | Admitting: Family Medicine

## 2017-06-25 DIAGNOSIS — Z1231 Encounter for screening mammogram for malignant neoplasm of breast: Secondary | ICD-10-CM

## 2017-07-08 ENCOUNTER — Ambulatory Visit
Admission: RE | Admit: 2017-07-08 | Discharge: 2017-07-08 | Disposition: A | Discharge status: Home / Self Care | Payer: Medicare Other | Source: Ambulatory Visit | Attending: Family Medicine | Admitting: Family Medicine

## 2017-07-08 DIAGNOSIS — Z1231 Encounter for screening mammogram for malignant neoplasm of breast: Secondary | ICD-10-CM | POA: Diagnosis present

## 2017-08-08 ENCOUNTER — Ambulatory Visit: Payer: Medicare Other | Attending: Internal Medicine

## 2017-08-08 DIAGNOSIS — F5101 Primary insomnia: Secondary | ICD-10-CM | POA: Diagnosis not present

## 2017-08-08 DIAGNOSIS — R0683 Snoring: Secondary | ICD-10-CM | POA: Diagnosis present

## 2018-10-15 ENCOUNTER — Emergency Department
Admission: EM | Admit: 2018-10-15 | Discharge: 2018-10-15 | Disposition: A | Discharge status: Home / Self Care | Payer: Medicare Other | Attending: Emergency Medicine | Admitting: Emergency Medicine

## 2018-10-15 ENCOUNTER — Encounter: Payer: Self-pay | Admitting: Emergency Medicine

## 2018-10-15 ENCOUNTER — Other Ambulatory Visit: Payer: Self-pay

## 2018-10-15 DIAGNOSIS — M5431 Sciatica, right side: Secondary | ICD-10-CM | POA: Diagnosis not present

## 2018-10-15 DIAGNOSIS — I10 Essential (primary) hypertension: Secondary | ICD-10-CM | POA: Diagnosis not present

## 2018-10-15 DIAGNOSIS — F17210 Nicotine dependence, cigarettes, uncomplicated: Secondary | ICD-10-CM | POA: Diagnosis not present

## 2018-10-15 DIAGNOSIS — Z7982 Long term (current) use of aspirin: Secondary | ICD-10-CM | POA: Insufficient documentation

## 2018-10-15 DIAGNOSIS — M545 Low back pain: Secondary | ICD-10-CM | POA: Diagnosis present

## 2018-10-15 DIAGNOSIS — Z79899 Other long term (current) drug therapy: Secondary | ICD-10-CM | POA: Diagnosis not present

## 2018-10-15 MED ORDER — CYCLOBENZAPRINE HCL 5 MG PO TABS: ORAL_TABLET | ORAL | 0 refills | Status: DC

## 2018-10-15 MED ORDER — MELOXICAM 15 MG PO TABS: 15.0000 mg | ORAL_TABLET | Freq: Every day | ORAL | 0 refills | Status: AC

## 2018-10-15 MED ORDER — PREDNISONE 10 MG PO TABS: ORAL_TABLET | ORAL | 0 refills | Status: DC

## 2018-10-15 MED ORDER — KETOROLAC TROMETHAMINE 30 MG/ML IJ SOLN
30.0000 mg | Freq: Once | INTRAMUSCULAR | Status: AC
  Administered 2018-10-15: 30 mg via INTRAMUSCULAR
  Filled 2018-10-15: qty 1

## 2018-10-15 NOTE — ED Provider Notes (Signed)
[facility] Emergency Department Provider Note  ____________________________________________  Time seen: Approximately 10:17 AM  I have reviewed the triage vital signs and the nursing notes.   HISTORY  Chief Complaint Back Pain    HPI Samantha Guerra is a 47 y.o. female presents emergency department for evaluation of low back pain that radiates into right hip and down since last night.  Patient states that she was getting out of the bathtub and her right foot slipped and she landed on her right hip.  She has been walking but with pain.  She does not think that anything is broken.  No bowel or bladder dysfunction or saddle anesthesias.  No dysuria, calf pain, leg swelling, numbness, tingling.  Past Medical History:  Diagnosis Date  . Fibromyalgia   . Hypertension   . Migraine     There are no active problems to display for this patient.   Past Surgical History:  Procedure Laterality Date  . ABDOMINAL HYSTERECTOMY      Prior to Admission medications   Medication Sig Start Date End Date Taking? Authorizing Provider  aspirin 81 MG tablet Take 81 mg by mouth daily.    [provider]  chlorpheniramine-HYDROcodone (TUSSIONEX PENNKINETIC ER) 10-8 MG/5ML SUER Take 5 mLs by mouth 2 (two) times daily. 01/20/17   Irean HongSung, Jade J, MD  cyclobenzaprine (FLEXERIL) 5 MG tablet Take 1-2 tablets 3 times daily as needed 10/15/18   Enid DerryWagner, Brendaliz Kuk, PA-C  gabapentin (NEURONTIN) 600 MG tablet Take 600 mg by mouth 3 (three) times daily.    [provider]  hydrochlorothiazide (HYDRODIURIL) 25 MG tablet Take 25 mg by mouth daily.    [provider]  ibuprofen (ADVIL,MOTRIN) 800 MG tablet Take 1 tablet (800 mg total) by mouth every 8 (eight) hours as needed for moderate pain. 01/05/17   Irean HongSung, Jade J, MD  magic mouthwash SOLN Take 10 mLs by mouth 3 (three) times daily as needed for mouth pain. 01/20/17   Irean HongSung, Jade J, MD  meloxicam (MOBIC) 15 MG tablet Take 1  tablet (15 mg total) by mouth daily for 10 days. 10/15/18 10/25/18  Enid DerryWagner, Adia Crammer, PA-C  naproxen (NAPROSYN) 500 MG tablet Take 1 tablet (500 mg total) by mouth 2 (two) times daily with a meal. 10/22/16   Hagler, Jami L, PA-C  ondansetron (ZOFRAN ODT) 4 MG disintegrating tablet Allow 1-2 tablets to dissolve in your mouth every 8 hours as needed for nausea/vomiting 12/02/16   Loleta RoseForbach, Cory, MD  oxyCODONE-acetaminophen (ROXICET) 5-325 MG tablet Take 1 tablet by mouth every 4 (four) hours as needed for severe pain. 01/05/17   Irean HongSung, Jade J, MD  predniSONE (DELTASONE) 10 MG tablet Take 6 tablets on day 1, take 5 tablets on day 2, take 4 tablets on day 3, take 3 tablets on day 4, take 2 tablets on day 5, take 1 tablet on day 6 10/15/18   Enid DerryWagner, Maveryck Bahri, PA-C  topiramate (TOPAMAX) 25 MG tablet Take 25 mg by mouth 2 (two) times daily.    [provider]  trazodone (DESYREL) 300 MG tablet Take 300 mg by mouth at bedtime.    [provider]  zolpidem (AMBIEN) 5 MG tablet Take 5 mg by mouth at bedtime as needed for sleep.    [provider]    Allergies Patient has no known allergies.  Family History  Problem Relation Age of Onset  . Breast cancer Neg Hx     Social History Social History   Tobacco  Use  . Smoking status: Current Every Day Smoker    Packs/day: 1.00    Types: Cigarettes  . Smokeless tobacco: Never Used  Substance Use Topics  . Alcohol use: No  . Drug use: No     Review of Systems  Constitutional: No fever/chills Respiratory: No SOB. Gastrointestinal: No abdominal pain.  No nausea, no vomiting.  Genitourinary: Negative for dysuria. Musculoskeletal: Positive for back and hip pain.  Skin: Negative for rash, abrasions, lacerations, ecchymosis. Neurological: Negative for numbness or tingling   ____________________________________________   PHYSICAL EXAM:  VITAL SIGNS: ED Triage Vitals  Enc Vitals Group     BP 10/15/18 0931 (!) 158/101      Pulse Rate 10/15/18 0931 74     Resp 10/15/18 0931 18     Temp 10/15/18 0931 98 F (36.7 C)     Temp Source 10/15/18 0931 Oral     SpO2 10/15/18 0931 99 %     Weight 10/15/18 0932 220 lb (99.8 kg)     Height 10/15/18 0932 5\' 7"  (1.702 m)     Head Circumference --      Peak Flow --      Pain Score 10/15/18 0932 7     Pain Loc --      Pain Edu? --      Excl. in GC? --      Constitutional: Alert and oriented. Well appearing and in no acute distress. Eyes: Conjunctivae are normal. PERRL. EOMI. Head: Atraumatic. ENT:      Ears:      Nose: No congestion/rhinnorhea.      Mouth/Throat: Mucous membranes are moist.  Neck: No stridor.   Cardiovascular: Normal rate, regular rhythm.  Good peripheral circulation. Respiratory: Normal respiratory effort without tachypnea or retractions. Lungs CTAB. Good air entry to the bases with no decreased or absent breath sounds. Gastrointestinal: Bowel sounds 4 quadrants. Soft and nontender to palpation. No guarding or rigidity. No palpable masses. No distention.  Musculoskeletal: Full range of motion to all extremities. No gross deformities appreciated.  Tenderness to palpation to right SI joint.  Full range of motion of right hip.  Strength equal in lower extremities bilaterally.  Antalgic gait. Neurologic:  Normal speech and language. No gross focal neurologic deficits are appreciated.  Skin:  Skin is warm, dry and intact. No rash noted. Psychiatric: Mood and affect are normal. Speech and behavior are normal. Patient exhibits appropriate insight and judgement.   ____________________________________________   LABS (all labs ordered are listed, but only abnormal results are displayed)  Labs Reviewed - No data to display ____________________________________________  EKG   ____________________________________________  RADIOLOGY   No results found.  ____________________________________________    PROCEDURES  Procedure(s) performed:     Procedures    Medications  ketorolac (TORADOL) 30 MG/ML injection 30 mg (30 mg Intramuscular Given 10/15/18 1024)     ____________________________________________   INITIAL IMPRESSION / ASSESSMENT AND PLAN / ED COURSE  Pertinent labs & imaging results that were available during my care of the patient were reviewed by me and considered in my medical decision making (see chart for details).  Review of the [place] CSRS was performed in accordance of the [facility] prior to dispensing any controlled drugs.     Patient's diagnosis is consistent with sciatica.  Vital signs and exam are reassuring.  Imaging was offered and patient declines.  Exam is consistent with sciatica.  IM Toradol was given.  Patient will be discharged home with prescriptions for meloxicam, prednisone,  Flexeril. Patient is to follow up with primary care as directed. Patient is given ED precautions to return to the ED for any worsening or new symptoms.     ____________________________________________  FINAL CLINICAL IMPRESSION(S) / ED DIAGNOSES  Final diagnoses:  Sciatica of right side      NEW MEDICATIONS STARTED DURING THIS VISIT:  ED Discharge Orders         Ordered    predniSONE (DELTASONE) 10 MG tablet     10/15/18 1047    meloxicam (MOBIC) 15 MG tablet  Daily     10/15/18 1047    cyclobenzaprine (FLEXERIL) 5 MG tablet     10/15/18 1047              This chart was dictated using voice recognition software/Dragon. Despite best efforts to proofread, errors can occur which can change the meaning. Any change was purely unintentional.    Enid Derry, PA-C 10/15/18 1134    Emily Filbert, MD 10/15/18 1345

## 2018-10-15 NOTE — ED Notes (Signed)
See triage note  Presents with pain to right hip into right leg  States she developed some lower back pain after lifting something at work about 1 week ago  Then fell last pm  Ambulates with slight limp d/t pain

## 2018-10-15 NOTE — ED Triage Notes (Signed)
Pt here with c/o right lower back pain that is radiating to right leg for the past few days, slipped last night in the tub which has made the pain worse per pt. Walked with limp in triage, denied need for wheelchair. NAD.

## 2019-02-17 ENCOUNTER — Ambulatory Visit: Admit: 2019-02-17 | Payer: Medicare Other | Admitting: Otolaryngology

## 2019-02-17 SURGERY — LARYNGOSCOPY, DIRECT
Anesthesia: General | Laterality: Bilateral

## 2019-02-26 ENCOUNTER — Emergency Department: Payer: Federal, State, Local not specified - PPO

## 2019-02-26 ENCOUNTER — Other Ambulatory Visit: Payer: Self-pay

## 2019-02-26 ENCOUNTER — Emergency Department
Admission: EM | Admit: 2019-02-26 | Discharge: 2019-02-26 | Disposition: A | Discharge status: Home / Self Care | Payer: Federal, State, Local not specified - PPO | Attending: Emergency Medicine | Admitting: Emergency Medicine

## 2019-02-26 DIAGNOSIS — S60221A Contusion of right hand, initial encounter: Secondary | ICD-10-CM | POA: Diagnosis not present

## 2019-02-26 DIAGNOSIS — W228XXA Striking against or struck by other objects, initial encounter: Secondary | ICD-10-CM | POA: Insufficient documentation

## 2019-02-26 DIAGNOSIS — Y990 Civilian activity done for income or pay: Secondary | ICD-10-CM | POA: Diagnosis not present

## 2019-02-26 DIAGNOSIS — Y9289 Other specified places as the place of occurrence of the external cause: Secondary | ICD-10-CM | POA: Insufficient documentation

## 2019-02-26 DIAGNOSIS — S6991XA Unspecified injury of right wrist, hand and finger(s), initial encounter: Secondary | ICD-10-CM | POA: Diagnosis present

## 2019-02-26 DIAGNOSIS — Y9389 Activity, other specified: Secondary | ICD-10-CM | POA: Insufficient documentation

## 2019-02-26 MED ORDER — MELOXICAM 15 MG PO TABS: 15.0000 mg | ORAL_TABLET | Freq: Every day | ORAL | 0 refills | Status: DC

## 2019-02-26 MED ORDER — MELOXICAM 7.5 MG PO TABS
15.0000 mg | ORAL_TABLET | Freq: Once | ORAL | Status: AC
  Administered 2019-02-26: 21:00:00 15 mg via ORAL
  Filled 2019-02-26: qty 2

## 2019-02-26 NOTE — ED Triage Notes (Signed)
Pt in with co right hand pain and swelling, states she injured it at work. Does not want to file workmans comp.

## 2019-02-26 NOTE — ED Provider Notes (Signed)
[facility] Emergency Department Provider Note  ____________________________________________  Time seen: Approximately 8:03 PM  I have reviewed the triage vital signs and the nursing notes.   HISTORY  Chief Complaint Hand Pain    HPI Samantha Guerra is a 48 y.o. female who presents the emergency department complaining of right hand pain/injury that occurred at work.  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  Patient reports that at the time the injury was not bad and she continue to work.  She noted later that the area that she has struck against the cart and started to swell.  There is swelling increased through the night.  She reports that it feels "bruised."  She has full range of motion all digits right hand.  No significant visible ecchymosis.  No overlying open wound to include puncture or laceration.        Past Medical History:  Diagnosis Date  . Fibromyalgia   . Hypertension   . Migraine     There are no active problems to display for this patient.   Past Surgical History:  Procedure Laterality Date  . ABDOMINAL HYSTERECTOMY      Prior to Admission medications   Medication Sig Start Date End Date Taking? Authorizing Provider  aspirin 81 MG tablet Take 81 mg by mouth daily.    [provider]  chlorpheniramine-HYDROcodone (TUSSIONEX PENNKINETIC ER) 10-8 MG/5ML SUER Take 5 mLs by mouth 2 (two) times daily. 01/20/17   Irean Hong, MD  cyclobenzaprine (FLEXERIL) 5 MG tablet Take 1-2 tablets 3 times daily as needed 10/15/18   Enid Derry, PA-C  gabapentin (NEURONTIN) 600 MG tablet Take 600 mg by mouth 3 (three) times daily.    [provider]  hydrochlorothiazide (HYDRODIURIL) 25 MG tablet Take 25 mg by mouth daily.    [provider]  ibuprofen (ADVIL,MOTRIN) 800 MG tablet Take 1 tablet (800 mg total) by mouth every 8  (eight) hours as needed for moderate pain. 01/05/17   Irean Hong, MD  magic mouthwash SOLN Take 10 mLs by mouth 3 (three) times daily as needed for mouth pain. 01/20/17   Irean Hong, MD  meloxicam (MOBIC) 15 MG tablet Take 1 tablet (15 mg total) by mouth daily. 02/26/19   Sahirah Rudell, Delorise Royals, PA-C  naproxen (NAPROSYN) 500 MG tablet Take 1 tablet (500 mg total) by mouth 2 (two) times daily with a meal. 10/22/16   Hagler, Jami L, PA-C  ondansetron (ZOFRAN ODT) 4 MG disintegrating tablet Allow 1-2 tablets to dissolve in your mouth every 8 hours as needed for nausea/vomiting 12/02/16   Loleta Rose, MD  oxyCODONE-acetaminophen (ROXICET) 5-325 MG tablet Take 1 tablet by mouth every 4 (four) hours as needed for severe pain. 01/05/17   Irean Hong, MD  predniSONE (DELTASONE) 10 MG tablet Take 6 tablets on day 1, take 5 tablets on day 2, take 4 tablets on day 3, take 3 tablets on day 4, take 2 tablets on day 5, take 1 tablet on day 6 10/15/18   Enid Derry, PA-C  topiramate (TOPAMAX) 25 MG tablet Take 25 mg by mouth 2 (two) times daily.    [provider]  trazodone (DESYREL) 300 MG tablet Take 300 mg by mouth at bedtime.    [provider]  zolpidem (AMBIEN) 5 MG tablet Take 5 mg by mouth at bedtime as needed  for sleep.    [provider]    Allergies Patient has no known allergies.  Family History  Problem Relation Age of Onset  . Breast cancer Neg Hx     Social History Social History   Tobacco Use  . Smoking status: Current Every Day Smoker    Packs/day: 1.00    Types: Cigarettes  . Smokeless tobacco: Never Used  Substance Use Topics  . Alcohol use: No  . Drug use: No     Review of Systems  Constitutional: No fever/chills Eyes: No visual changes.  Cardiovascular: no chest pain. Respiratory: no cough. No SOB. Gastrointestinal: No abdominal pain.  No nausea, no vomiting.  Musculoskeletal: Positive for right hand pain, swelling, injury Skin: Negative  for rash, abrasions, lacerations, ecchymosis. Neurological: Negative for headaches, focal weakness or numbness. 10-point ROS otherwise negative.  ____________________________________________   PHYSICAL EXAM:  VITAL SIGNS: ED Triage Vitals  Enc Vitals Group     BP 02/26/19 1959 (!) 177/96     Pulse Rate 02/26/19 1959 75     Resp 02/26/19 1959 18     Temp 02/26/19 1959 98.2 F (36.8 C)     Temp Source 02/26/19 1959 Oral     SpO2 02/26/19 1959 99 %     Weight 02/26/19 1957 215 lb (97.5 kg)     Height 02/26/19 1957  (1.702 m)     Head Circumference --      Peak Flow --      Pain Score 02/26/19 1957 8     Pain Loc --      Pain Edu? --      Excl. in GC? --      Constitutional: Alert and oriented. Well appearing and in no acute distress. Eyes: Conjunctivae are normal. PERRL. EOMI. Head: Atraumatic. ENT:      Ears:       Nose: No congestion/rhinnorhea.      Mouth/Throat: Mucous membranes are moist.  Neck: No stridor.    Cardiovascular: Normal rate, regular rhythm. Normal S1 and S2.  Good peripheral circulation. Respiratory: Normal respiratory effort without tachypnea or retractions. Lungs CTAB. Good air entry to the bases with no decreased or absent breath sounds. Musculoskeletal: Full range of motion to all extremities. No gross deformities appreciated.  Visualization of the right hand reveals obvious edema to the dorsal aspect of the hand.  No open wounds identified.  No significant ecchymosis.  Area is very tender to palpation.  No palpable abnormality.  Patient is tender to palpation over the snuffbox.  However she is also tender throughout the area of edema.  Full range of motion all digits.  Sensation intact all digits.  Capillary refill less than 2 seconds all digits. Neurologic:  Normal speech and language. No gross focal neurologic deficits are appreciated.  Skin:  Skin is warm, dry and intact. No rash noted. Psychiatric: Mood and affect are normal. Speech and  behavior are normal. Patient exhibits appropriate insight and judgement.   ____________________________________________   LABS (all labs ordered are listed, but only abnormal results are displayed)  Labs Reviewed - No data to display ____________________________________________  EKG   ____________________________________________  RADIOLOGY I personally viewed and evaluated these images as part of my medical decision making, as well as reviewing the written report by the radiologist.  Soft tissue edema with no evidence of acute osseous abnormality.  Dg Hand Complete Right  Result Date: 02/26/2019 CLINICAL DATA:  Right hand pain and swelling EXAM: RIGHT HAND -  COMPLETE 3+ VIEW COMPARISON:  None. FINDINGS: There is no acute fracture or dislocation. There is no evidence of arthropathy or other focal bone abnormality. Soft tissues are unremarkable. Chronic fragment at the ulnar styloid. IMPRESSION: No acute fracture or dislocation of the right hand. Electronically Signed   By: Deatra RobinsonKevin  Herman M.D.   On: 02/26/2019 20:31    ____________________________________________    PROCEDURES  Procedure(s) performed:    .Splint Application Date/Time: 02/26/2019 8:54 PM Performed by: Racheal Patchesuthriell, Kadden Osterhout D, PA-C Authorized by: Racheal Patchesuthriell, Lin Glazier D, PA-C   Consent:    Consent obtained:  Verbal   Consent given by:  Patient   Risks discussed:  Pain and swelling Pre-procedure details:    Sensation:  Normal Procedure details:    Laterality:  Right   Location:  Hand   Hand:  R hand   Splint type:  Wrist   Supplies:  Prefabricated splint Post-procedure details:    Pain:  Improved   Sensation:  Normal   Patient tolerance of procedure:  Tolerated well, no immediate complications      Medications  meloxicam (MOBIC) tablet 15 mg (has no administration in time range)     ____________________________________________   INITIAL IMPRESSION / ASSESSMENT AND PLAN / ED COURSE  Pertinent  labs & imaging results that were available during my care of the patient were reviewed by me and considered in my medical decision making (see chart for details).  Review of the [place] CSRS was performed in accordance of the [facility] prior to dispensing any controlled drugs.           Patient's diagnosis is consistent with hand hematoma.  Patient presented to the emergency department with edema to the dorsal aspect of the right hand.  Given mechanism of injury I am most suspicious for hematoma.  Differential included ligament injury, fracture, hematoma.  Given the location with tenderness over the snuffbox, I did caution the patient that if symptoms did not improve with conservative therapy she needed to follow-up with orthopedics for further evaluation.  Patient is given Velcro wrist splint, prescription for meloxicam.  Follow-up with orthopedics as needed.. Patient is given ED precautions to return to the ED for any worsening or new symptoms.     ____________________________________________  FINAL CLINICAL IMPRESSION(S) / ED DIAGNOSES  Final diagnoses:  Traumatic hematoma of right hand, initial encounter      NEW MEDICATIONS STARTED DURING THIS VISIT:  ED Discharge Orders         Ordered    meloxicam (MOBIC) 15 MG tablet  Daily     02/26/19 2054              This chart was dictated using voice recognition software/Dragon. Despite best efforts to proofread, errors can occur which can change the meaning. Any change was purely unintentional.    Racheal PatchesCuthriell, Keionte Swicegood D, PA-C 02/26/19 2056    Don PerkingVeronese, [place], MD 02/26/19 2249

## 2019-02-26 NOTE — ED Notes (Signed)
Right hand splint applied. Patient discharged to home.

## 2019-02-26 NOTE — ED Notes (Signed)
Patient reports hit a metal box at work accidentally didn't hurt enough to be seen at doctor, but when she got home hand swelled up with some pain. Here to have it evaluated.

## 2019-05-21 ENCOUNTER — Other Ambulatory Visit: Payer: Self-pay | Admitting: Family Medicine

## 2019-05-21 DIAGNOSIS — Z1231 Encounter for screening mammogram for malignant neoplasm of breast: Secondary | ICD-10-CM

## 2019-05-25 ENCOUNTER — Ambulatory Visit
Admission: RE | Admit: 2019-05-25 | Discharge: 2019-05-25 | Disposition: A | Discharge status: Home / Self Care | Payer: Federal, State, Local not specified - PPO | Source: Ambulatory Visit | Attending: Family Medicine | Admitting: Family Medicine

## 2019-05-25 DIAGNOSIS — Z1231 Encounter for screening mammogram for malignant neoplasm of breast: Secondary | ICD-10-CM | POA: Insufficient documentation

## 2019-05-26 ENCOUNTER — Ambulatory Visit: Admit: 2019-05-26 | Payer: Medicare Other | Admitting: Otolaryngology

## 2019-05-26 SURGERY — MICROLARYNGOSCOPY WITH CO2 LASER AND EXCISION OF VOCAL CORD LESION
Anesthesia: General | Laterality: Bilateral

## 2019-11-03 ENCOUNTER — Encounter: Payer: Self-pay | Admitting: Emergency Medicine

## 2019-11-03 ENCOUNTER — Other Ambulatory Visit: Payer: Self-pay

## 2019-11-03 ENCOUNTER — Emergency Department: Payer: Self-pay

## 2019-11-03 ENCOUNTER — Emergency Department
Admission: EM | Admit: 2019-11-03 | Discharge: 2019-11-03 | Disposition: A | Discharge status: Home / Self Care | Payer: Self-pay | Attending: Student | Admitting: Student

## 2019-11-03 DIAGNOSIS — Z7982 Long term (current) use of aspirin: Secondary | ICD-10-CM | POA: Insufficient documentation

## 2019-11-03 DIAGNOSIS — W010XXA Fall on same level from slipping, tripping and stumbling without subsequent striking against object, initial encounter: Secondary | ICD-10-CM | POA: Insufficient documentation

## 2019-11-03 DIAGNOSIS — F17210 Nicotine dependence, cigarettes, uncomplicated: Secondary | ICD-10-CM | POA: Insufficient documentation

## 2019-11-03 DIAGNOSIS — M25562 Pain in left knee: Secondary | ICD-10-CM | POA: Insufficient documentation

## 2019-11-03 DIAGNOSIS — Y929 Unspecified place or not applicable: Secondary | ICD-10-CM | POA: Insufficient documentation

## 2019-11-03 DIAGNOSIS — I10 Essential (primary) hypertension: Secondary | ICD-10-CM | POA: Insufficient documentation

## 2019-11-03 DIAGNOSIS — Y939 Activity, unspecified: Secondary | ICD-10-CM | POA: Insufficient documentation

## 2019-11-03 DIAGNOSIS — Z79899 Other long term (current) drug therapy: Secondary | ICD-10-CM | POA: Insufficient documentation

## 2019-11-03 DIAGNOSIS — Y999 Unspecified external cause status: Secondary | ICD-10-CM | POA: Insufficient documentation

## 2019-11-03 MED ORDER — HYDROCODONE-ACETAMINOPHEN 5-325 MG PO TABS: 1.0000 | ORAL_TABLET | Freq: Four times a day (QID) | ORAL | 0 refills | Status: AC | PRN

## 2019-11-03 MED ORDER — MELOXICAM 15 MG PO TABS: 15.0000 mg | ORAL_TABLET | Freq: Every day | ORAL | 0 refills | Status: DC

## 2019-11-03 MED ORDER — HYDROCODONE-ACETAMINOPHEN 5-325 MG PO TABS
1.0000 | ORAL_TABLET | Freq: Once | ORAL | Status: DC
Start: 2019-11-03 — End: 2019-11-04
  Filled 2019-11-03: qty 1

## 2019-11-03 NOTE — ED Notes (Signed)
Pt states having pain in her left knee upon palpation and with walking. Pt states she can still walk well and just has to deal with the pain. Pt knee is dry & warm with no swelling.

## 2019-11-03 NOTE — ED Triage Notes (Signed)
Pt in via POV, reports mechanical fall on Christmas day, reports landing in a split, attempting to manage at home but without any relief.  Pain to left knee, reports significant bruising to area as well.  Ambulatory to triage.  NAD noted at this time.

## 2019-11-03 NOTE — Discharge Instructions (Signed)
Please call and schedule an appointment with orthopedics if not improving over the week.  Keep your leg elevated when possible.

## 2019-11-03 NOTE — ED Provider Notes (Signed)
[facility] Emergency Department Provider Note ____________________________________________  Time seen: Approximately 8:10 PM  I have reviewed the triage vital signs and the nursing notes.   HISTORY  Chief Complaint Fall and Knee Pain    HPI Samantha Guerra is a 49 y.o. female who presents to the emergency department for evaluation and treatment of left knee pain after mechanical, non-syncopal fall almost 2 weeks ago. She landed with her leg extended outward. She has had pain in the left knee since. No relief with heat, ice, rest, or NSAIDs.   Past Medical History:  Diagnosis Date  . Fibromyalgia   . Hypertension   . Migraine     There are no problems to display for this patient.   Past Surgical History:  Procedure Laterality Date  . ABDOMINAL HYSTERECTOMY      Prior to Admission medications   Medication Sig Start Date End Date Taking? Authorizing Provider  aspirin 81 MG tablet Take 81 mg by mouth daily.    [provider]  chlorpheniramine-HYDROcodone (TUSSIONEX PENNKINETIC ER) 10-8 MG/5ML SUER Take 5 mLs by mouth 2 (two) times daily. 01/20/17   Paulette Blanch, MD  cyclobenzaprine (FLEXERIL) 5 MG tablet Take 1-2 tablets 3 times daily as needed 10/15/18   Laban Emperor, PA-C  gabapentin (NEURONTIN) 600 MG tablet Take 600 mg by mouth 3 (three) times daily.    [provider]  hydrochlorothiazide (HYDRODIURIL) 25 MG tablet Take 25 mg by mouth daily.    [provider]  HYDROcodone-acetaminophen (NORCO/VICODIN) 5-325 MG tablet Take 1 tablet by mouth every 6 (six) hours as needed for up to 3 days for severe pain. 11/03/19 11/06/19  Kaelan Emami, Dessa Phi, FNP  magic mouthwash SOLN Take 10 mLs by mouth 3 (three) times daily as needed for mouth pain. 01/20/17   Paulette Blanch, MD  meloxicam (MOBIC) 15 MG tablet Take 1 tablet (15 mg total) by mouth daily. 11/03/19   Brealynn Contino, Johnette Abraham B, FNP  ondansetron (ZOFRAN ODT) 4 MG disintegrating tablet Allow 1-2  tablets to dissolve in your mouth every 8 hours as needed for nausea/vomiting 12/02/16   Hinda Kehr, MD  predniSONE (DELTASONE) 10 MG tablet Take 6 tablets on day 1, take 5 tablets on day 2, take 4 tablets on day 3, take 3 tablets on day 4, take 2 tablets on day 5, take 1 tablet on day 6 10/15/18   Laban Emperor, PA-C  topiramate (TOPAMAX) 25 MG tablet Take 25 mg by mouth 2 (two) times daily.    [provider]  trazodone (DESYREL) 300 MG tablet Take 300 mg by mouth at bedtime.    [provider]  zolpidem (AMBIEN) 5 MG tablet Take 5 mg by mouth at bedtime as needed for sleep.    [provider]    Allergies Patient has no known allergies.  Family History  Problem Relation Age of Onset  . Breast cancer Neg Hx     Social History Social History   Tobacco Use  . Smoking status: Current Every Day Smoker    Packs/day: 1.00    Types: Cigarettes  . Smokeless tobacco: Never Used  Substance Use Topics  . Alcohol use: No  . Drug use: No    Review of Systems Constitutional: Negative for fever. Cardiovascular: Negative for chest pain. Respiratory: Negative for shortness of breath. Musculoskeletal: Positive for left knee pain. Skin: Negative for open wounds or lesions.  Neurological: Negative for decrease in sensation  ____________________________________________   PHYSICAL  EXAM:  VITAL SIGNS: ED Triage Vitals  Enc Vitals Group     BP 11/03/19 1849 (!) 146/84     Pulse Rate 11/03/19 1849 71     Resp 11/03/19 1849 18     Temp 11/03/19 1849 98.3 F (36.8 C)     Temp Source 11/03/19 1849 Oral     SpO2 11/03/19 1849 100 %     Weight 11/03/19 1849 220 lb (99.8 kg)     Height 11/03/19 1849 5\' 7"  (1.702 m)     Head Circumference --      Peak Flow --      Pain Score 11/03/19 1856 7     Pain Loc --      Pain Edu? --      Excl. in GC? --     Constitutional: Alert and oriented. Well appearing and in no acute distress. Eyes: Conjunctivae are clear  without discharge or drainage Head: Atraumatic Neck: supple Respiratory: No cough. Respirations are even and unlabored. Musculoskeletal: McMurray's, Lachman's, anterior and posterior drawer testing are all reassuring.  Negative ballottement.  No increase in pain with varus or valgus stressors. Neurologic: Motor and sensory function is intact. Skin: No contusions or open wounds noted. Psychiatric: Affect and behavior are appropriate.  ____________________________________________   LABS (all labs ordered are listed, but only abnormal results are displayed)  Labs Reviewed - No data to display ____________________________________________  RADIOLOGY  Image of the left knee shows no acute bony abnormality per radiology. ____________________________________________   PROCEDURES  Procedures  ____________________________________________   INITIAL IMPRESSION / ASSESSMENT AND PLAN / ED COURSE  Samantha Guerra is a 49 y.o. who presents to the emergency department for treatment and evaluation after mechanical, nonsyncopal fall almost 2 weeks ago.  Exam is reassuring.  X-ray is negative for acute findings.  She will be placed in a knee immobilizer and given meloxicam and a few Norco to take after she gets off work if the pain is severe.  She is advised to see orthopedics if symptoms do not improve over the week.  She was instructed to return to the emergency department for symptoms of change or worsen if she is unable to see primary care or orthopedics.   Medications  HYDROcodone-acetaminophen (NORCO/VICODIN) 5-325 MG per tablet 1 tablet (1 tablet Oral Refused 11/03/19 2051)    Pertinent labs & imaging results that were available during my care of the patient were reviewed by me and considered in my medical decision making (see chart for details).  _________________________________________   FINAL CLINICAL IMPRESSION(S) / ED DIAGNOSES  Final diagnoses:  Acute pain of left knee    ED  Discharge Orders         Ordered    meloxicam (MOBIC) 15 MG tablet  Daily     11/03/19 2030    HYDROcodone-acetaminophen (NORCO/VICODIN) 5-325 MG tablet  Every 6 hours PRN     11/03/19 2030           If controlled substance prescribed during this visit, 12 month history viewed on the [place] prior to issuing an initial prescription for Schedule II or III opiod.   01/01/20, FNP 11/03/19 2328    2329., MD 11/04/19 [phone]

## 2020-04-15 ENCOUNTER — Institutional Professional Consult (permissible substitution): Payer: Self-pay | Admitting: Plastic Surgery

## 2020-05-11 ENCOUNTER — Other Ambulatory Visit: Payer: Self-pay | Admitting: Family Medicine

## 2020-05-11 DIAGNOSIS — Z1231 Encounter for screening mammogram for malignant neoplasm of breast: Secondary | ICD-10-CM

## 2020-05-23 ENCOUNTER — Other Ambulatory Visit: Payer: Self-pay

## 2020-05-23 ENCOUNTER — Encounter: Payer: Self-pay | Admitting: Emergency Medicine

## 2020-05-23 ENCOUNTER — Emergency Department: Payer: Self-pay

## 2020-05-23 DIAGNOSIS — I10 Essential (primary) hypertension: Secondary | ICD-10-CM | POA: Insufficient documentation

## 2020-05-23 DIAGNOSIS — Z79899 Other long term (current) drug therapy: Secondary | ICD-10-CM | POA: Insufficient documentation

## 2020-05-23 DIAGNOSIS — R109 Unspecified abdominal pain: Secondary | ICD-10-CM | POA: Insufficient documentation

## 2020-05-23 DIAGNOSIS — N390 Urinary tract infection, site not specified: Secondary | ICD-10-CM | POA: Insufficient documentation

## 2020-05-23 DIAGNOSIS — Z7982 Long term (current) use of aspirin: Secondary | ICD-10-CM | POA: Insufficient documentation

## 2020-05-23 DIAGNOSIS — M79A29 Nontraumatic compartment syndrome of unspecified lower extremity: Secondary | ICD-10-CM | POA: Insufficient documentation

## 2020-05-23 DIAGNOSIS — F17210 Nicotine dependence, cigarettes, uncomplicated: Secondary | ICD-10-CM | POA: Insufficient documentation

## 2020-05-23 LAB — POCT PREGNANCY, URINE: Preg Test, Ur: NEGATIVE

## 2020-05-23 LAB — CBC
HCT: 45.8 % (ref 36.0–46.0)
Hemoglobin: 14.7 g/dL (ref 12.0–15.0)
MCH: 29.3 pg (ref 26.0–34.0)
MCHC: 32.1 g/dL (ref 30.0–36.0)
MCV: 91.2 fL (ref 80.0–100.0)
Platelets: 240 10*3/uL (ref 150–400)
RBC: 5.02 MIL/uL (ref 3.87–5.11)
RDW: 14.1 % (ref 11.5–15.5)
WBC: 13.9 10*3/uL — ABNORMAL HIGH (ref 4.0–10.5)
nRBC: 0 % (ref 0.0–0.2)

## 2020-05-23 LAB — COMPREHENSIVE METABOLIC PANEL
ALT: 20 U/L (ref 0–44)
AST: 18 U/L (ref 15–41)
Albumin: 3.2 g/dL — ABNORMAL LOW (ref 3.5–5.0)
Alkaline Phosphatase: 51 U/L (ref 38–126)
Anion gap: 4 — ABNORMAL LOW (ref 5–15)
BUN: 16 mg/dL (ref 6–20)
CO2: 28 mmol/L (ref 22–32)
Calcium: 8.6 mg/dL — ABNORMAL LOW (ref 8.9–10.3)
Chloride: 111 mmol/L (ref 98–111)
Creatinine, Ser: 0.9 mg/dL (ref 0.44–1.00)
GFR calc Af Amer: >60 mL/min (ref >60)
GFR calc non Af Amer: >60 mL/min (ref >60)
Glucose, Bld: 106 mg/dL — ABNORMAL HIGH (ref 70–99)
Potassium: 3.9 mmol/L (ref 3.5–5.1)
Sodium: 143 mmol/L (ref 135–145)
Total Bilirubin: 0.6 mg/dL (ref 0.3–1.2)
Total Protein: 6.1 g/dL — ABNORMAL LOW (ref 6.5–8.1)

## 2020-05-23 LAB — URINALYSIS, COMPLETE (UACMP) WITH MICROSCOPIC
Bacteria, UA: NONE SEEN
Bilirubin Urine: NEGATIVE
Glucose, UA: NEGATIVE mg/dL
Hgb urine dipstick: NEGATIVE
Ketones, ur: NEGATIVE mg/dL
Nitrite: NEGATIVE
Protein, ur: NEGATIVE mg/dL
Specific Gravity, Urine: 1.024 (ref 1.005–1.030)
pH: 5 (ref 5.0–8.0)

## 2020-05-23 MED ORDER — ACETAMINOPHEN 325 MG PO TABS
650.0000 mg | ORAL_TABLET | Freq: Once | ORAL | Status: AC
  Administered 2020-05-23: 20:00:00 650 mg via ORAL
  Filled 2020-05-23: qty 2

## 2020-05-23 NOTE — ED Triage Notes (Signed)
Pt reports left flank pain for past week, reports urinary frequency pain radiates to left lower abdomen. Pt talks in complete sentences no distress noted

## 2020-05-24 ENCOUNTER — Emergency Department: Payer: Self-pay

## 2020-05-24 ENCOUNTER — Emergency Department
Admission: EM | Admit: 2020-05-24 | Discharge: 2020-05-24 | Disposition: A | Discharge status: Home / Self Care | Payer: Self-pay | Attending: Emergency Medicine | Admitting: Emergency Medicine

## 2020-05-24 DIAGNOSIS — R109 Unspecified abdominal pain: Secondary | ICD-10-CM

## 2020-05-24 DIAGNOSIS — N390 Urinary tract infection, site not specified: Secondary | ICD-10-CM

## 2020-05-24 MED ORDER — FOSFOMYCIN TROMETHAMINE 3 G PO PACK
3.0000 g | PACK | Freq: Once | ORAL | Status: AC
  Administered 2020-05-24: 02:00:00 3 g via ORAL
  Filled 2020-05-24: qty 3

## 2020-05-24 MED ORDER — HYDROCODONE-ACETAMINOPHEN 5-325 MG PO TABS: 1.0000 | ORAL_TABLET | Freq: Four times a day (QID) | ORAL | 0 refills | Status: DC | PRN

## 2020-05-24 MED ORDER — KETOROLAC TROMETHAMINE 60 MG/2ML IM SOLN
30.0000 mg | Freq: Once | INTRAMUSCULAR | Status: AC
  Administered 2020-05-24: 02:00:00 30 mg via INTRAMUSCULAR
  Filled 2020-05-24: qty 2

## 2020-05-24 MED ORDER — IBUPROFEN 800 MG PO TABS: 800.0000 mg | ORAL_TABLET | Freq: Three times a day (TID) | ORAL | 0 refills | Status: DC | PRN

## 2020-05-24 NOTE — ED Notes (Signed)
Pt has left side flank pain for 1 week.  No n/v/d.  No hx kidney stones.  Pt has urinary frequency.  Family with pt  Pt alert.

## 2020-05-24 NOTE — ED Notes (Signed)
Dr sung in with pt again

## 2020-05-24 NOTE — Discharge Instructions (Addendum)
1.  You may take pain medicines as needed (Motrin/Norco #15). 2.  Apply moist heat to affected area several times daily. 3.  Return to the ER for worsening symptoms, persistent vomiting, fever or other concerns.

## 2020-05-24 NOTE — ED Provider Notes (Signed)
[facility] Emergency Department Provider Note   ____________________________________________   First MD Initiated Contact with Patient 05/24/20 0127     (approximate)  I have reviewed the triage vital signs and the nursing notes.   HISTORY  Chief Complaint Flank Pain (left side )    HPI Samantha Guerra is a 49 y.o. female who presents to the ED from home with a chief complaint of nontraumatic left leg pain.  Patient reports left flank pain x1 week associated with urinary frequency.  Occasionally pain radiates to her left lower abdomen.  Exacerbated by movements.  Denies fever, chills, cough, chest pain, shortness of breath, nausea, vomiting, hematuria, diarrhea.  Denies recent travel, trauma or hormone use.  Denies known COVID-19 exposure.      Past Medical History:  Diagnosis Date  . Fibromyalgia   . Hypertension   . Migraine     There are no problems to display for this patient.   Past Surgical History:  Procedure Laterality Date  . ABDOMINAL HYSTERECTOMY      Prior to Admission medications   Medication Sig Start Date End Date Taking? Authorizing Provider  aspirin 81 MG tablet Take 81 mg by mouth daily.    [provider]  chlorpheniramine-HYDROcodone (TUSSIONEX PENNKINETIC ER) 10-8 MG/5ML SUER Take 5 mLs by mouth 2 (two) times daily. 01/20/17   Irean Hong, MD  cyclobenzaprine (FLEXERIL) 5 MG tablet Take 1-2 tablets 3 times daily as needed 10/15/18   Enid Derry, PA-C  gabapentin (NEURONTIN) 600 MG tablet Take 600 mg by mouth 3 (three) times daily.    [provider]  hydrochlorothiazide (HYDRODIURIL) 25 MG tablet Take 25 mg by mouth daily.    [provider]  HYDROcodone-acetaminophen (NORCO) 5-325 MG tablet Take 1 tablet by mouth every 6 (six) hours as needed for moderate pain. 05/24/20   Irean Hong, MD  ibuprofen (ADVIL) 800 MG tablet Take 1 tablet (800 mg total) by mouth every 8 (eight) hours as needed for  moderate pain. 05/24/20   Irean Hong, MD  magic mouthwash SOLN Take 10 mLs by mouth 3 (three) times daily as needed for mouth pain. 01/20/17   Irean Hong, MD  meloxicam (MOBIC) 15 MG tablet Take 1 tablet (15 mg total) by mouth daily. 11/03/19   Triplett, Rulon Eisenmenger B, FNP  ondansetron (ZOFRAN ODT) 4 MG disintegrating tablet Allow 1-2 tablets to dissolve in your mouth every 8 hours as needed for nausea/vomiting 12/02/16   Loleta Rose, MD  predniSONE (DELTASONE) 10 MG tablet Take 6 tablets on day 1, take 5 tablets on day 2, take 4 tablets on day 3, take 3 tablets on day 4, take 2 tablets on day 5, take 1 tablet on day 6 10/15/18   Enid Derry, PA-C  topiramate (TOPAMAX) 25 MG tablet Take 25 mg by mouth 2 (two) times daily.    [provider]  trazodone (DESYREL) 300 MG tablet Take 300 mg by mouth at bedtime.    [provider]  zolpidem (AMBIEN) 5 MG tablet Take 5 mg by mouth at bedtime as needed for sleep.    [provider]    Allergies Patient has no known allergies.  Family History  Problem Relation Age of Onset  . Breast cancer Neg Hx     Social History Social History   Tobacco Use  . Smoking status: Current Every Day Smoker    Packs/day: 1.00    Types: Cigarettes  . Smokeless  tobacco: Never Used  Vaping Use  . Vaping Use: Never used  Substance Use Topics  . Alcohol use: No  . Drug use: No    Review of Systems  Constitutional: No fever/chills Eyes: No visual changes. ENT: No sore throat. Cardiovascular: Denies chest pain. Respiratory: Denies shortness of breath. Gastrointestinal: Positive for left flank to abdominal pain.  No nausea, no vomiting.  No diarrhea.  No constipation. Genitourinary: Negative for dysuria. Musculoskeletal: Positive for back pain. Skin: Negative for rash. Neurological: Negative for headaches, focal weakness or numbness.   ____________________________________________   PHYSICAL EXAM:  VITAL SIGNS: ED Triage Vitals    Enc Vitals Group     BP 05/23/20 1933 127/73     Pulse Rate 05/23/20 1933 83     Resp 05/23/20 1933 20     Temp 05/23/20 1933 98.4 F (36.9 C)     Temp Source 05/23/20 1933 Oral     SpO2 05/23/20 1933 98 %     Weight 05/23/20 1934 (!) 239 lb (108.4 kg)     Height 05/23/20 1934 5\' 7"  (1.702 m)     Head Circumference --      Peak Flow --      Pain Score 05/23/20 1933 6     Pain Loc --      Pain Edu? --      Excl. in GC? --     Constitutional: Alert and oriented. Well appearing and in no acute distress. Eyes: Conjunctivae are normal. PERRL. EOMI. Head: Atraumatic. Nose: No congestion/rhinnorhea. Mouth/Throat: Mucous membranes are moist.  Oropharynx non-erythematous. Neck: No stridor.   Cardiovascular: Normal rate, regular rhythm. Grossly normal heart sounds.  Good peripheral circulation. Respiratory: Normal respiratory effort.  No retractions. Lungs CTAB. Gastrointestinal: Soft and nontender to light or deep palpation. No distention. No abdominal bruits.  Mild left lower rib/CVA tenderness and pain on movement. Musculoskeletal: No lower extremity tenderness nor edema.  No joint effusions. Neurologic:  Normal speech and language. No gross focal neurologic deficits are appreciated. No gait instability. Skin:  Skin is warm, dry and intact. No rash noted.  No vesicles. Psychiatric: Mood and affect are normal. Speech and behavior are normal.  ____________________________________________   LABS (all labs ordered are listed, but only abnormal results are displayed)  Labs Reviewed  URINALYSIS, COMPLETE (UACMP) WITH MICROSCOPIC - Abnormal; Notable for the following components:      Result Value   Color, Urine YELLOW (*)    APPearance HAZY (*)    Leukocytes,Ua TRACE (*)    All other components within normal limits  CBC - Abnormal; Notable for the following components:   WBC 13.9 (*)    All other components within normal limits  COMPREHENSIVE METABOLIC PANEL - Abnormal; Notable for  the following components:   Glucose, Bld 106 (*)    Calcium 8.6 (*)    Total Protein 6.1 (*)    Albumin 3.2 (*)    Anion gap 4 (*)    All other components within normal limits  POCT PREGNANCY, URINE   ____________________________________________  EKG  None ____________________________________________  RADIOLOGY  ED MD interpretation: Patient declined CT renal colic study secondary to claustrophobia  Official radiology report(s): DG Chest 2 View  Result Date: 05/24/2020 CLINICAL DATA:  Left-sided chest pain for 1 week EXAM: CHEST - 2 VIEW COMPARISON:  04/16/2017 FINDINGS: The heart size and mediastinal contours are within normal limits. Both lungs are clear. The visualized skeletal structures are unremarkable. IMPRESSION: No active cardiopulmonary disease. Electronically  Signed   By: Alcide Clever M.D.   On: 05/24/2020 02:04    ____________________________________________   PROCEDURES  Procedure(s) performed (including Critical Care):  Procedures   ____________________________________________   INITIAL IMPRESSION / ASSESSMENT AND PLAN / ED COURSE  As part of my medical decision making, I reviewed the following data within the electronic MEDICAL RECORD NUMBER Nursing notes reviewed and incorporated, Labs reviewed, Old chart reviewed, Radiograph reviewed, Notes from prior ED visits and [place] Controlled Substance Database     Samantha Guerra was evaluated in Emergency Department on 05/24/2020 for the symptoms described in the history of present illness. She was evaluated in the context of the global COVID-19 pandemic, which necessitated consideration that the patient might be at risk for infection with the SARS-CoV-2 virus that causes COVID-19. Institutional protocols and algorithms that pertain to the evaluation of patients at risk for COVID-19 are in a state of rapid change based on information released by regulatory bodies including the CDC and federal and state organizations. These  policies and algorithms were followed during the patient's care in the ED.    49 year old female presenting with 1 week history of left flank pain. Differential diagnosis includes, but is not limited to, ovarian cyst, ovarian torsion, acute appendicitis, diverticulitis, urinary tract infection/pyelonephritis, endometriosis, bowel obstruction, colitis, renal colic, gastroenteritis, hernia, fibroids, endometriosis, etc.  Laboratory results demonstrate mild leukocytosis and leukocyte positive UTI.  Patient declined CT renal colic study secondary to claustrophobia.  Will obtain chest x-ray to rule out pneumonia.  Administer IM Toradol and fosfomycin for UTI.   Clinical Course as of May 24 254  Tue May 24, 2020  0223 Updated patient on x-ray results.  Will discharge home on NSAIDs, analgesia and she will follow up closely with her PCP.  Fosfomycin given in the ED.  Strict return precautions given.  Patient verbalizes understanding agrees with plan of care.   [JS]    Clinical Course User Index [JS] Irean Hong, MD     ____________________________________________   FINAL CLINICAL IMPRESSION(S) / ED DIAGNOSES  Final diagnoses:  Left flank pain  Lower urinary tract infectious disease     ED Discharge Orders         Ordered    ibuprofen (ADVIL) 800 MG tablet  Every 8 hours PRN     Discontinue  Reprint     05/24/20 0224    HYDROcodone-acetaminophen (NORCO) 5-325 MG tablet  Every 6 hours PRN     Discontinue  Reprint     05/24/20 0224           Note:  This document was prepared using Dragon voice recognition software and may include unintentional dictation errors.   Irean Hong, MD 05/24/20 [id number]

## 2020-05-26 ENCOUNTER — Other Ambulatory Visit: Payer: Self-pay

## 2020-05-26 ENCOUNTER — Ambulatory Visit
Admission: RE | Admit: 2020-05-26 | Discharge: 2020-05-26 | Disposition: A | Discharge status: Home / Self Care | Payer: POS | Source: Ambulatory Visit | Attending: Family Medicine | Admitting: Family Medicine

## 2020-05-26 DIAGNOSIS — Z1231 Encounter for screening mammogram for malignant neoplasm of breast: Secondary | ICD-10-CM | POA: Insufficient documentation

## 2020-11-10 ENCOUNTER — Ambulatory Visit: Payer: POS | Admitting: Gastroenterology

## 2020-11-18 ENCOUNTER — Other Ambulatory Visit: Payer: Self-pay

## 2020-11-18 ENCOUNTER — Encounter: Payer: Self-pay | Admitting: Emergency Medicine

## 2020-11-18 ENCOUNTER — Ambulatory Visit
Admission: EM | Admit: 2020-11-18 | Discharge: 2020-11-18 | Disposition: A | Discharge status: Home / Self Care | Payer: Federal, State, Local not specified - PPO

## 2020-11-18 DIAGNOSIS — J019 Acute sinusitis, unspecified: Secondary | ICD-10-CM

## 2020-11-18 DIAGNOSIS — R0981 Nasal congestion: Secondary | ICD-10-CM | POA: Diagnosis not present

## 2020-11-18 MED ORDER — AMOXICILLIN-POT CLAVULANATE 875-125 MG PO TABS: 1.0000 | ORAL_TABLET | Freq: Two times a day (BID) | ORAL | 0 refills | Status: DC

## 2020-11-18 NOTE — ED Provider Notes (Signed)
[facility]    CSN: [id number] Arrival date & time: 11/18/20  1641      History   Chief Complaint Chief Complaint  Patient presents with   Nasal Congestion   Facial Pain    HPI Samantha Guerra is a 50 y.o. female presenting for 2-week history of headaches, nasal congestion and drainage on sinus pain.  Patient admits to pain behind her eyes.  She states that over the past day she has noticed thick greenish nasal drainage.  She says it was not discolored until recently.  Patient says symptoms have progressively gotten worse.  She does report a negative COVID-19 PCR test a couple days ago.  She denies any fever, fatigue, body aches, chest pain, difficulty breathing, abdominal pain, nausea/vomiting or diarrhea.  Patient states she has had sinus infections in the past and it feels like a sinus infection to her.  She says it has been a long time since she has had a sinus infection.  She says she has been taking over-the-counter Sudafed, Mucinex, and using multiple nasal sprays without any improvement in her symptoms.  No other complaints or concerns today.  HPI  Past Medical History:  Diagnosis Date   Fibromyalgia    Hypertension    Migraine     There are no problems to display for this patient.   Past Surgical History:  Procedure Laterality Date   ABDOMINAL HYSTERECTOMY      OB History   No obstetric history on file.      Home Medications    Prior to Admission medications   Medication Sig Start Date End Date Taking? Authorizing Provider  amoxicillin-clavulanate (AUGMENTIN) 875-125 MG tablet Take 1 tablet by mouth every 12 (twelve) hours for 7 days. 11/18/20 11/25/20 Yes Shirlee Latch, PA-C  aspirin 81 MG tablet Take 81 mg by mouth daily.   Yes [provider]  amLODipine (NORVASC) 5 MG tablet Take 5 mg by mouth daily. 09/24/20   [provider]  chlorpheniramine-HYDROcodone (TUSSIONEX PENNKINETIC ER) 10-8 MG/5ML SUER Take 5 mLs by mouth 2  (two) times daily. 01/20/17   Irean Hong, MD  cyclobenzaprine (FLEXERIL) 5 MG tablet Take 1-2 tablets 3 times daily as needed 10/15/18   Enid Derry, PA-C  gabapentin (NEURONTIN) 600 MG tablet Take 600 mg by mouth 3 (three) times daily.    [provider]  hydrochlorothiazide (HYDRODIURIL) 25 MG tablet Take 25 mg by mouth daily.    [provider]  HYDROcodone-acetaminophen (NORCO) 5-325 MG tablet Take 1 tablet by mouth every 6 (six) hours as needed for moderate pain. 05/24/20   Irean Hong, MD  ibuprofen (ADVIL) 800 MG tablet Take 1 tablet (800 mg total) by mouth every 8 (eight) hours as needed for moderate pain. 05/24/20   Irean Hong, MD  magic mouthwash SOLN Take 10 mLs by mouth 3 (three) times daily as needed for mouth pain. 01/20/17   Irean Hong, MD  meloxicam (MOBIC) 15 MG tablet Take 1 tablet (15 mg total) by mouth daily. 11/03/19   Triplett, Rulon Eisenmenger B, FNP  ondansetron (ZOFRAN ODT) 4 MG disintegrating tablet Allow 1-2 tablets to dissolve in your mouth every 8 hours as needed for nausea/vomiting 12/02/16   Loleta Rose, MD  predniSONE (DELTASONE) 10 MG tablet Take 6 tablets on day 1, take 5 tablets on day 2, take 4 tablets on day 3, take 3 tablets on day 4, take 2 tablets on day 5, take 1 tablet on  day 6 10/15/18   Enid Derry, PA-C  topiramate (TOPAMAX) 25 MG tablet Take 25 mg by mouth 2 (two) times daily.    [provider]  trazodone (DESYREL) 300 MG tablet Take 300 mg by mouth at bedtime.    [provider]  zolpidem (AMBIEN) 5 MG tablet Take 5 mg by mouth at bedtime as needed for sleep.    [provider]    Family History Family History  Problem Relation Age of Onset   Breast cancer Neg Hx     Social History Social History   Tobacco Use   Smoking status: Current Every Day Smoker    Packs/day: 1.00    Types: Cigarettes   Smokeless tobacco: Never Used  Vaping Use   Vaping Use: Never used  Substance Use Topics   Alcohol  use: No   Drug use: No     Allergies   Patient has no known allergies.   Review of Systems Review of Systems  Constitutional: Negative for chills, diaphoresis, fatigue and fever.  HENT: Positive for congestion, postnasal drip, rhinorrhea and sinus pressure. Negative for ear pain, sinus pain and sore throat.   Respiratory: Positive for cough. Negative for shortness of breath.   Gastrointestinal: Negative for abdominal pain, nausea and vomiting.  Musculoskeletal: Negative for arthralgias and myalgias.  Skin: Negative for rash.  Neurological: Positive for headaches. Negative for dizziness and weakness.  Hematological: Negative for adenopathy.     Physical Exam Triage Vital Signs ED Triage Vitals  Enc Vitals Group     BP 11/18/20 1747 (!) 153/92     Pulse Rate 11/18/20 1747 78     Resp 11/18/20 1747 16     Temp 11/18/20 1747 98.1 F (36.7 C)     Temp Source 11/18/20 1747 Oral     SpO2 11/18/20 1747 100 %     Weight 11/18/20 1742 230 lb (104.3 kg)     Height 11/18/20 1742 5\' 7"  (1.702 m)     Head Circumference --      Peak Flow --      Pain Score 11/18/20 1741 6     Pain Loc --      Pain Edu? --      Excl. in GC? --    No data found.  Updated Vital Signs BP (!) 153/92 (BP Location: Left Arm)    Pulse 78    Temp 98.1 F (36.7 C) (Oral)    Resp 16    Ht 5\' 7"  (1.702 m)    Wt 230 lb (104.3 kg)    SpO2 100%    BMI 36.02 kg/m       Physical Exam Vitals and nursing note reviewed.  Constitutional:      General: She is not in acute distress.    Appearance: Normal appearance. She is not ill-appearing or toxic-appearing.  HENT:     Head: Normocephalic and atraumatic.     Right Ear: Hearing, tympanic membrane, ear canal and external ear normal.     Left Ear: Hearing, tympanic membrane, ear canal and external ear normal.     Nose: Mucosal edema, congestion and rhinorrhea present.     Right Sinus: Maxillary sinus tenderness present.     Left Sinus: Maxillary sinus  tenderness present.     Mouth/Throat:     Mouth: Mucous membranes are moist.     Pharynx: Oropharynx is clear. Posterior oropharyngeal erythema (with light yellow thick PND) present.  Eyes:  General: No scleral icterus.       Right eye: No discharge.        Left eye: No discharge.     Conjunctiva/sclera: Conjunctivae normal.  Cardiovascular:     Rate and Rhythm: Normal rate and regular rhythm.     Heart sounds: Normal heart sounds.  Pulmonary:     Effort: Pulmonary effort is normal. No respiratory distress.     Breath sounds: Normal breath sounds.  Musculoskeletal:     Cervical back: Neck supple.  Skin:    General: Skin is dry.  Neurological:     General: No focal deficit present.     Mental Status: She is alert. Mental status is at baseline.     Motor: No weakness.     Gait: Gait normal.  Psychiatric:        Mood and Affect: Mood normal.        Behavior: Behavior normal.        Thought Content: Thought content normal.      UC Treatments / Results  Labs (all labs ordered are listed, but only abnormal results are displayed) Labs Reviewed - No data to display  EKG   Radiology No results found.  Procedures Procedures (including critical care time)  Medications Ordered in UC Medications - No data to display  Initial Impression / Assessment and Plan / UC Course  I have reviewed the triage vital signs and the nursing notes.  Pertinent labs & imaging results that were available during my care of the patient were reviewed by me and considered in my medical decision making (see chart for details).    50 year old female presenting for 2-week history of sinus pain/pressure and nasal congestion.    Exam and course of illness are consistent with acute bacterial sinusitis.  Patient has tried multiple over-the-counter medications without any improvement in symptoms and symptoms continue to get worse.  Treating at this time with Augmentin.  Advised her to continue Mucinex  and Flonase.  Advised increasing rest and fluids.  Advised to follow-up with our clinic as needed for any new or worsening symptoms.  Patient agreeable.  Final Clinical Impressions(s) / UC Diagnoses   Final diagnoses:  Acute sinusitis, recurrence not specified, unspecified location  Nasal congestion     Discharge Instructions     SINUSITIS: Symptoms consistent with sinus infection. May consider use of intranasal steroids, such as Flonase as well. Use medications as directed. If antibiotics are prescribed, take the full course of antibiotics. Also consider use of Sudafed or Mucinex D. Increase rest, fluids. If you do not improve or if you worsen after a course of antibiotics, you should be re-examined. You may need a different antibiotic or further evaluation with imaging or an exam of the inside of the sinuses     ED Prescriptions    Medication Sig Dispense Auth. Provider   amoxicillin-clavulanate (AUGMENTIN) 875-125 MG tablet Take 1 tablet by mouth every 12 (twelve) hours for 7 days. 14 tablet Gareth Morgan     PDMP not reviewed this encounter.   Shirlee Latch, PA-C 11/18/20 1827

## 2020-11-18 NOTE — Discharge Instructions (Signed)
SINUSITIS: Symptoms consistent with sinus infection. May consider use of intranasal steroids, such as Flonase as well. Use medications as directed. If antibiotics are prescribed, take the full course of antibiotics. Also consider use of Sudafed or Mucinex D. Increase rest, fluids. If you do not improve or if you worsen after a course of antibiotics, you should be re-examined. You may need a different antibiotic or further evaluation with imaging or an exam of the inside of the sinuses

## 2020-11-18 NOTE — ED Triage Notes (Signed)
Pt was tested for Covid 11/18/20 (negative). Today she c/o of nasal congestion, sinus pressure and headache. Denies fever.

## 2020-11-22 ENCOUNTER — Other Ambulatory Visit: Payer: Self-pay

## 2020-11-24 ENCOUNTER — Ambulatory Visit: Payer: Federal, State, Local not specified - PPO | Admitting: Gastroenterology

## 2020-11-24 ENCOUNTER — Other Ambulatory Visit: Payer: Self-pay

## 2020-11-24 ENCOUNTER — Encounter: Payer: Self-pay | Admitting: Gastroenterology

## 2020-11-24 VITALS — BP 152/95 | HR 87 | Temp 98.3°F | Ht 67.0 in | Wt 246.2 lb

## 2020-11-24 DIAGNOSIS — K219 Gastro-esophageal reflux disease without esophagitis: Secondary | ICD-10-CM

## 2020-11-24 DIAGNOSIS — Z86010 Personal history of colonic polyps: Secondary | ICD-10-CM | POA: Diagnosis not present

## 2020-11-24 MED ORDER — CLENPIQ 10-3.5-12 MG-GM -GM/160ML PO SOLN: 320.0000 mL | Freq: Once | ORAL | 0 refills | Status: AC

## 2020-11-24 MED ORDER — OMEPRAZOLE 40 MG PO CPDR
40.0000 mg | DELAYED_RELEASE_CAPSULE | Freq: Every day | ORAL | 2 refills | Status: DC
Start: 2020-11-24 — End: 2024-05-13

## 2020-11-24 NOTE — Progress Notes (Signed)
Arlyss Repress, MD [address]  [address]  [place], [place] [address]  Main: [phone]  Fax: [phone]    Gastroenterology Consultation  Referring Provider:     Preston Guerra* Primary Care Physician:  Samantha Fleeting, MD Primary Gastroenterologist:  Dr. Arlyss Repress Reason for Consultation:     Chronic GERD and history of colon polyps        HPI:   Samantha Guerra is a 50 y.o. female referred by Dr. Neita Guerra, Samantha Raddle, MD  for consultation & management of chronic GERD and history of colon polyps  Chronic GERD: Patient reports several years history of acid reflux associated with regurgitation, worse at night, sometimes associated with difficulty swallowing, globus sensation.  Patient reports that she underwent upper endoscopy in 2018 by [facility] gastroenterology, she was told that she has inflammation in her esophagus as well as she was dilated back then.  She was prescribed Protonix at that time, which she did not take it.  She acknowledges drinking 12 cans of Pepsi daily.  She does smoke 1 pack/day.  She does have early dinner as well.  She is trying to cut back on fatty foods.  She works at [employer] office  NSAIDs: None  Antiplts/Anticoagulants/Anti thrombotics: None  GI Procedures: Colonoscopy in 2018, found to have colon polyps and she was told she needs repeat colonoscopy in 3 to 5 years  She reports maternal grandmother with colon cancer  Past Medical History:  Diagnosis Date  . Fibromyalgia   . Hypertension   . Migraine     Past Surgical History:  Procedure Laterality Date  . ABDOMINAL HYSTERECTOMY      Current Outpatient Medications:  .  amLODipine (NORVASC) 5 MG tablet, Take 5 mg by mouth daily., Disp: , Rfl:  .  aspirin 81 MG tablet, Take 81 mg by mouth daily., Disp: , Rfl:  .  omeprazole (PRILOSEC) 40 MG capsule, Take 1 capsule (40 mg total) by mouth daily before supper., Disp: 30 capsule, Rfl: 2 .  Sod  Picosulfate-Mag Ox-Cit Acd (CLENPIQ) 10-3.5-12 MG-GM -GM/160ML SOLN, Take 320 mLs by mouth once for 1 dose., Disp: 320 mL, Rfl: 0 .  ibuprofen (ADVIL) 800 MG tablet, Take 1 tablet (800 mg total) by mouth every 8 (eight) hours as needed for moderate pain. (Patient not taking: Reported on 11/24/2020), Disp: 15 tablet, Rfl: 0   Family History  Problem Relation Age of Onset  . Breast cancer Neg Hx      Social History   Tobacco Use  . Smoking status: Current Every Day Smoker    Packs/day: 1.00    Types: Cigarettes  . Smokeless tobacco: Never Used  Vaping Use  . Vaping Use: Never used  Substance Use Topics  . Alcohol use: No  . Drug use: No    Allergies as of 11/24/2020  . (No Known Allergies)    Review of Systems:    All systems reviewed and negative except where noted in HPI.   Physical Exam:  BP (!) 152/95 (BP Location: Left Arm, Patient Position: Sitting, Cuff Size: Normal)   Pulse 87   Temp 98.3 F (36.8 C) (Oral)   Ht 5\' 7"  (1.702 m)   Wt 246 lb 4 oz (111.7 kg)   BMI 38.57 kg/m  No LMP recorded. Patient has had a hysterectomy.  General:   Alert,  Well-developed, well-nourished, pleasant and cooperative in NAD Head:  Normocephalic and atraumatic. Eyes:  Sclera clear, no  icterus.   Conjunctiva pink. Ears:  Normal auditory acuity. Nose:  No deformity, discharge, or lesions. Mouth:  No deformity or lesions,oropharynx pink & moist. Neck:  Supple; no masses or thyromegaly. Lungs:  Respirations even and unlabored.  Clear throughout to auscultation.   No wheezes, crackles, or rhonchi. No acute distress. Heart:  Regular rate and rhythm; no murmurs, clicks, rubs, or gallops. Abdomen:  Normal bowel sounds. Soft, non-tender and non-distended without masses, hepatosplenomegaly or hernias noted.  No guarding or rebound tenderness.   Rectal: Not performed Msk:  Symmetrical without gross deformities. Good, equal movement & strength bilaterally. Pulses:  Normal pulses  noted. Extremities:  No clubbing or edema.  No cyanosis. Neurologic:  Alert and oriented x3;  grossly normal neurologically. Skin:  Intact without significant lesions or rashes. No jaundice. Lymph Nodes:  No significant cervical adenopathy. Psych:  Alert and cooperative. Normal mood and affect.  Imaging Studies: None  Assessment and Plan:   Samantha Guerra is a 50 y.o. pleasant African-American female with BMI 38.57, chronic GERD, hypertension poorly controlled, chronic tobacco use is seen in consultation for chronic GERD and personal history of colon polyps  Chronic GERD Discussed in detail regarding antireflux lifestyle, information provided Recommend to start omeprazole 40 mg daily before dinner Recommend EGD for further evaluation  Personal history of colon polyps Recommend surveillance colonoscopy   Follow up in 3 months   Arlyss Repress, MD

## 2020-11-24 NOTE — Patient Instructions (Signed)
Gastroesophageal Reflux Disease, Adult  Gastroesophageal reflux (GER) happens when acid from the stomach flows up into the tube that connects the mouth and the stomach (esophagus). Normally, food travels down the esophagus and stays in the stomach to be digested. With GER, food and stomach acid sometimes move back up into the esophagus. You may have a disease called gastroesophageal reflux disease (GERD) if the reflux:  Happens often.  Causes frequent or very bad symptoms.  Causes problems such as damage to the esophagus. When this happens, the esophagus becomes sore and swollen. Over time, GERD can make small holes (ulcers) in the lining of the esophagus. What are the causes? This condition is caused by a problem with the muscle between the esophagus and the stomach. When this muscle is weak or not normal, it does not close properly to keep food and acid from coming back up from the stomach. The muscle can be weak because of:  Tobacco use.  Pregnancy.  Having a certain type of hernia (hiatal hernia).  Alcohol use.  Certain foods and drinks, such as coffee, chocolate, onions, and peppermint. What increases the risk?  Being overweight.  Having a disease that affects your connective tissue.  Taking NSAIDs, such a ibuprofen. What are the signs or symptoms?  Heartburn.  Difficult or painful swallowing.  The feeling of having a lump in the throat.  A bitter taste in the mouth.  Bad breath.  Having a lot of saliva.  Having an upset or bloated stomach.  Burping.  Chest pain. Different conditions can cause chest pain. Make sure you see your doctor if you have chest pain.  Shortness of breath or wheezing.  A long-term cough or a cough at night.  Wearing away of the surface of teeth (tooth enamel).  Weight loss. How is this treated?  Making changes to your diet.  Taking medicine.  Having surgery. Treatment will depend on how bad your symptoms are. Follow these  instructions at home: Eating and drinking  Follow a diet as told by your doctor. You may need to avoid foods and drinks such as: ? Coffee and tea, with or without caffeine. ? Drinks that contain alcohol. ? Energy drinks and sports drinks. ? Bubbly (carbonated) drinks or sodas. ? Chocolate and cocoa. ? Peppermint and mint flavorings. ? Garlic and onions. ? Horseradish. ? Spicy and acidic foods. These include peppers, chili powder, curry powder, vinegar, hot sauces, and BBQ sauce. ? Citrus fruit juices and citrus fruits, such as oranges, lemons, and limes. ? Tomato-based foods. These include red sauce, chili, salsa, and pizza with red sauce. ? Fried and fatty foods. These include donuts, french fries, potato chips, and high-fat dressings. ? High-fat meats. These include hot dogs, rib eye steak, sausage, ham, and bacon. ? High-fat dairy items, such as whole milk, butter, and cream cheese.  Eat small meals often. Avoid eating large meals.  Avoid drinking large amounts of liquid with your meals.  Avoid eating meals during the 2-3 hours before bedtime.  Avoid lying down right after you eat.  Do not exercise right after you eat.   Lifestyle  Do not smoke or use any products that contain nicotine or tobacco. If you need help quitting, ask your doctor.  Try to lower your stress. If you need help doing this, ask your doctor.  If you are overweight, lose an amount of weight that is healthy for you. Ask your doctor about a safe weight loss goal.   General instructions    Pay attention to any changes in your symptoms.  Take over-the-counter and prescription medicines only as told by your doctor.  Do not take aspirin, ibuprofen, or other NSAIDs unless your doctor says it is okay.  Wear loose clothes. Do not wear anything tight around your waist.  Raise (elevate) the head of your bed about 6 inches (15 cm). You may need to use a wedge to do this.  Avoid bending over if this makes your  symptoms worse.  Keep all follow-up visits. Contact a doctor if:  You have new symptoms.  You lose weight and you do not know why.  You have trouble swallowing or it hurts to swallow.  You have wheezing or a cough that keeps happening.  You have a hoarse voice.  Your symptoms do not get better with treatment. Get help right away if:  You have sudden pain in your arms, neck, jaw, teeth, or back.  You suddenly feel sweaty, dizzy, or light-headed.  You have chest pain or shortness of breath.  You vomit and the vomit is green, yellow, or black, or it looks like blood or coffee grounds.  You faint.  Your poop (stool) is red, bloody, or black.  You cannot swallow, drink, or eat. These symptoms may represent a serious problem that is an emergency. Do not wait to see if the symptoms will go away. Get medical help right away. Call your local emergency services (911 in the [place] Do not drive yourself to the hospital. Summary  If a person has gastroesophageal reflux disease (GERD), food and stomach acid move back up into the esophagus and cause symptoms or problems such as damage to the esophagus.  Treatment will depend on how bad your symptoms are.  Follow a diet as told by your doctor.  Take all medicines only as told by your doctor. This information is not intended to replace advice given to you by your health care provider. Make sure you discuss any questions you have with your health care provider. Document Revised: 04/25/2020 Document Reviewed: 04/25/2020 Elsevier Patient Education  2021 [employer]  

## 2020-11-29 ENCOUNTER — Telehealth: Payer: Self-pay | Admitting: Gastroenterology

## 2020-11-29 ENCOUNTER — Other Ambulatory Visit: Admission: RE | Admit: 2020-11-29 | Payer: Federal, State, Local not specified - PPO | Source: Ambulatory Visit

## 2020-11-29 NOTE — Telephone Encounter (Signed)
Call patient to reschedule her procedure scheduled for 12/01/20

## 2020-11-29 NOTE — Telephone Encounter (Signed)
Patient states that her mom is in hospice and they had to rush her to the hospital this morning. She is canceling her procedure and rescheduled it  Updated referral and called endo and talked to Chip Boer and she will cancel the procedure patient will call back and rescheduled

## 2020-12-01 ENCOUNTER — Encounter: Admission: RE | Payer: Self-pay | Source: Home / Self Care

## 2020-12-01 ENCOUNTER — Ambulatory Visit
Admission: RE | Admit: 2020-12-01 | Payer: Federal, State, Local not specified - PPO | Source: Home / Self Care | Admitting: Gastroenterology

## 2020-12-01 SURGERY — COLONOSCOPY WITH PROPOFOL
Anesthesia: General

## 2021-02-01 IMAGING — MG DIGITAL SCREENING BILATERAL MAMMOGRAM WITH TOMO AND CAD
8 series · 8 of 24 positions shown · non-contrast
Comparison: Previous exam(s).

CLINICAL DATA: Screening.

EXAM:
DIGITAL SCREENING BILATERAL MAMMOGRAM WITH TOMO AND CAD

[L CC synth-2D]
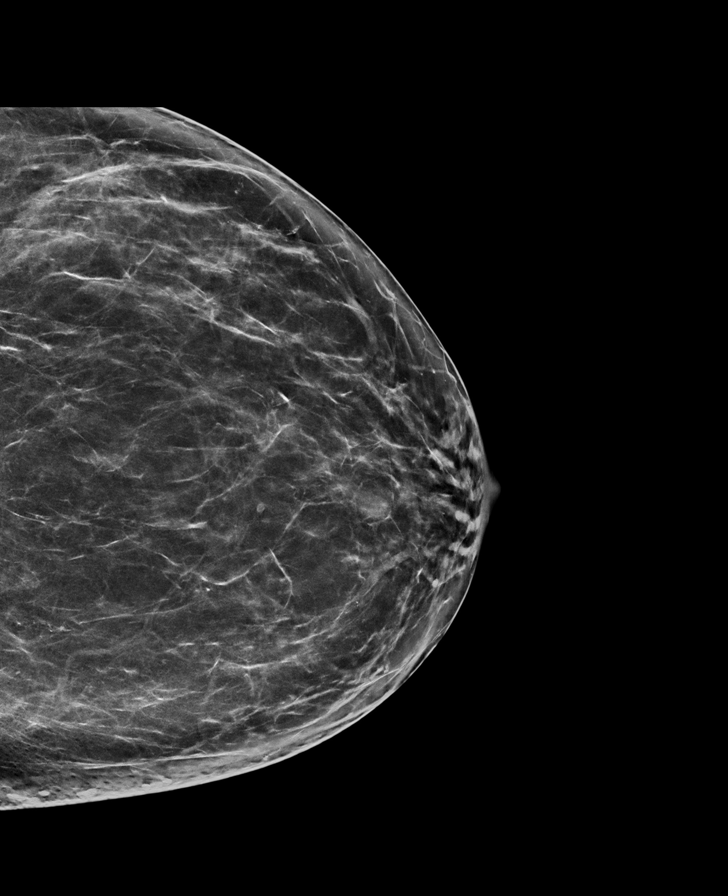

[R MLO synth-2D]
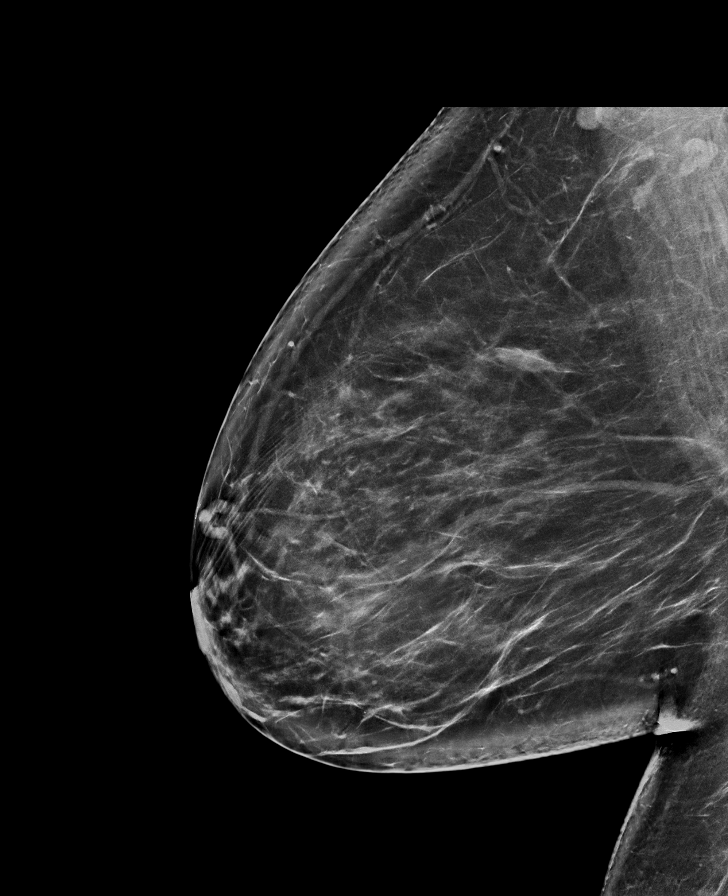

[L MLO synth-2D]
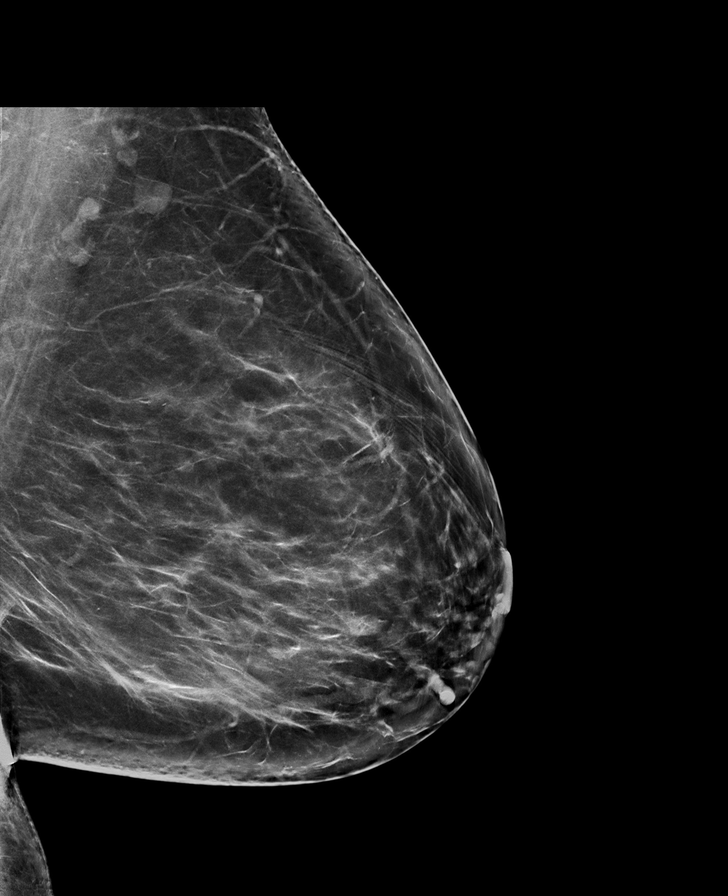

[R CC synth-2D]
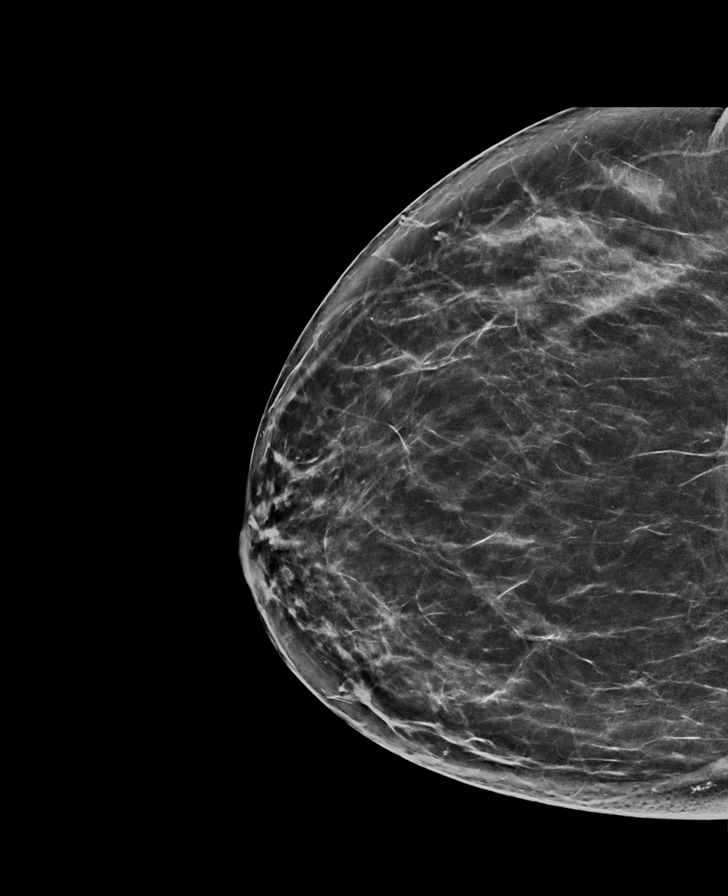

[L CC tomo · tomo slice 39/78.0]
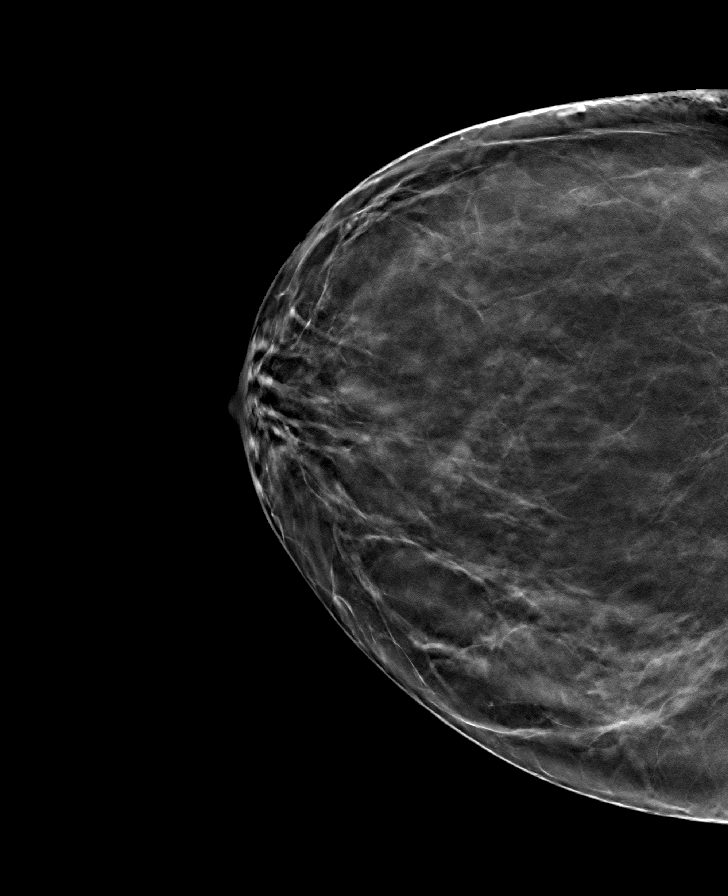

[R MLO tomo · tomo slice 46/91.0]
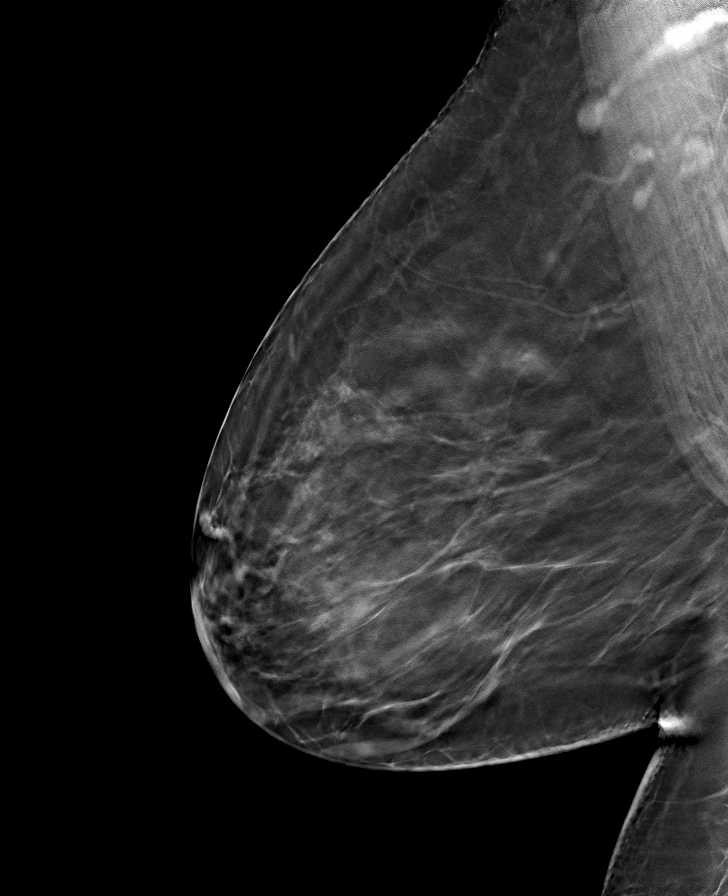

[L MLO tomo · tomo slice 47/93.0]
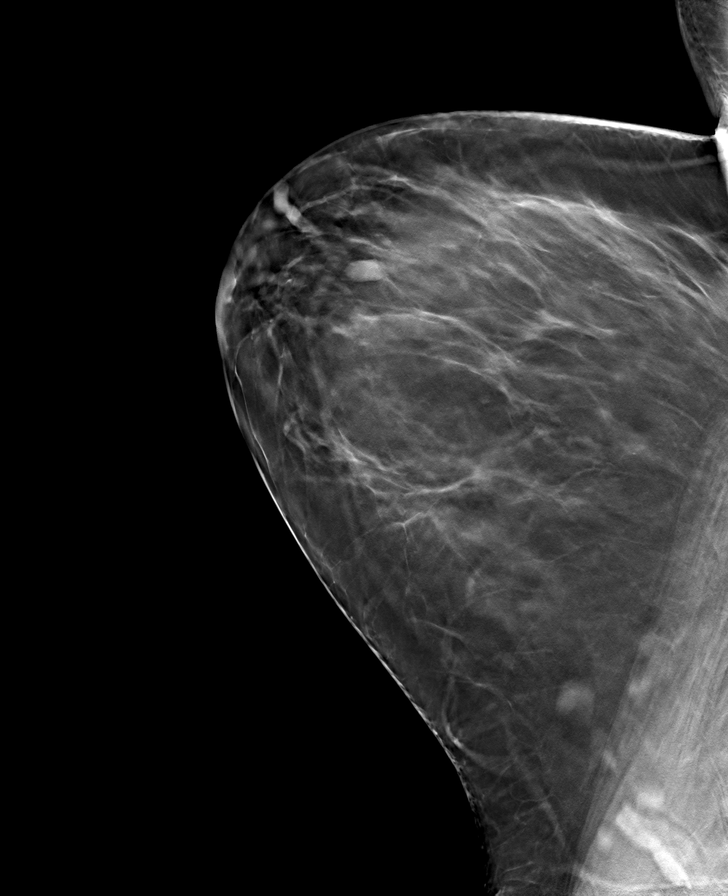

[R CC tomo · tomo slice 39/76.0]
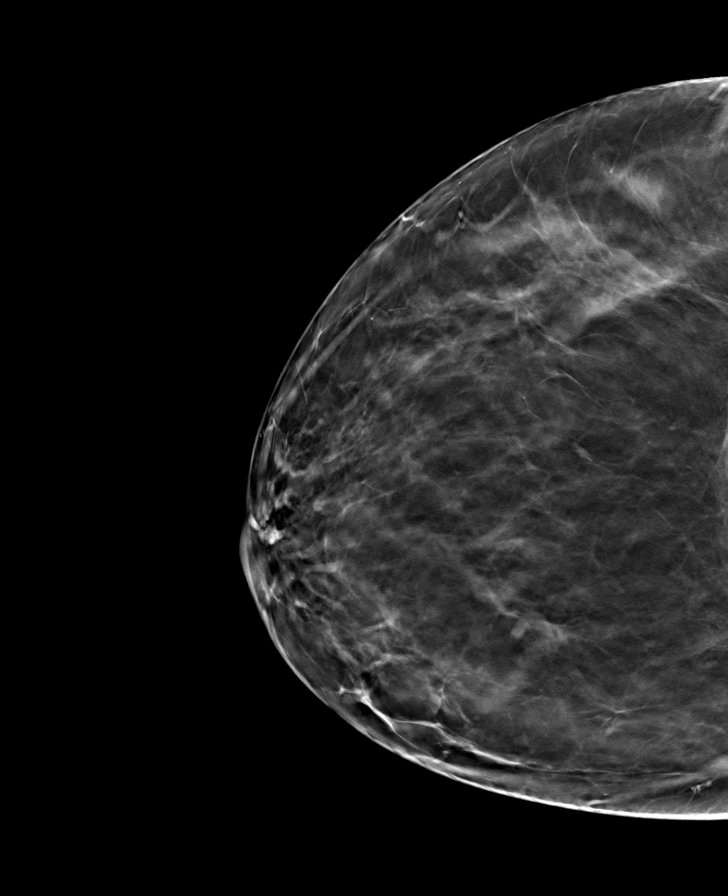

[8 of 24 positions shown; findings below may reference images not displayed]

ACR Breast Density Category c: The breast tissue is heterogeneously
dense, which may obscure small masses.
FINDINGS: There are no findings suspicious for malignancy. Images were
processed with CAD.
IMPRESSION: No mammographic evidence of malignancy. A result letter of this
screening mammogram will be mailed directly to the patient.

RECOMMENDATION:
Screening mammogram in one year. (Code:[id number])

BI-RADS CATEGORY  1: Negative.

## 2021-02-23 ENCOUNTER — Ambulatory Visit: Payer: Federal, State, Local not specified - PPO | Admitting: Gastroenterology

## 2021-05-18 ENCOUNTER — Other Ambulatory Visit: Payer: Self-pay | Admitting: Family Medicine

## 2021-05-18 ENCOUNTER — Other Ambulatory Visit: Payer: Self-pay | Admitting: Hematology and Oncology

## 2021-05-30 ENCOUNTER — Other Ambulatory Visit: Payer: Self-pay | Admitting: Family Medicine

## 2021-05-30 DIAGNOSIS — Z1231 Encounter for screening mammogram for malignant neoplasm of breast: Secondary | ICD-10-CM

## 2021-06-22 ENCOUNTER — Ambulatory Visit
Admission: RE | Admit: 2021-06-22 | Discharge: 2021-06-22 | Disposition: A | Discharge status: Home / Self Care | Payer: Federal, State, Local not specified - PPO | Source: Ambulatory Visit | Attending: Family Medicine | Admitting: Family Medicine

## 2021-06-22 ENCOUNTER — Other Ambulatory Visit: Payer: Self-pay

## 2021-06-22 DIAGNOSIS — Z1231 Encounter for screening mammogram for malignant neoplasm of breast: Secondary | ICD-10-CM | POA: Diagnosis present

## 2022-02-01 IMAGING — CR DG CHEST 2V
1 series · 2 of 2 positions shown · non-contrast
Comparison: 04/16/2017

CLINICAL DATA: Left-sided chest pain for 1 week

EXAM:
CHEST - 2 VIEW

[Series 1: dg chest 2 view · 0.14mm/px · 2 of 2 slices shown]
[im 1/2]
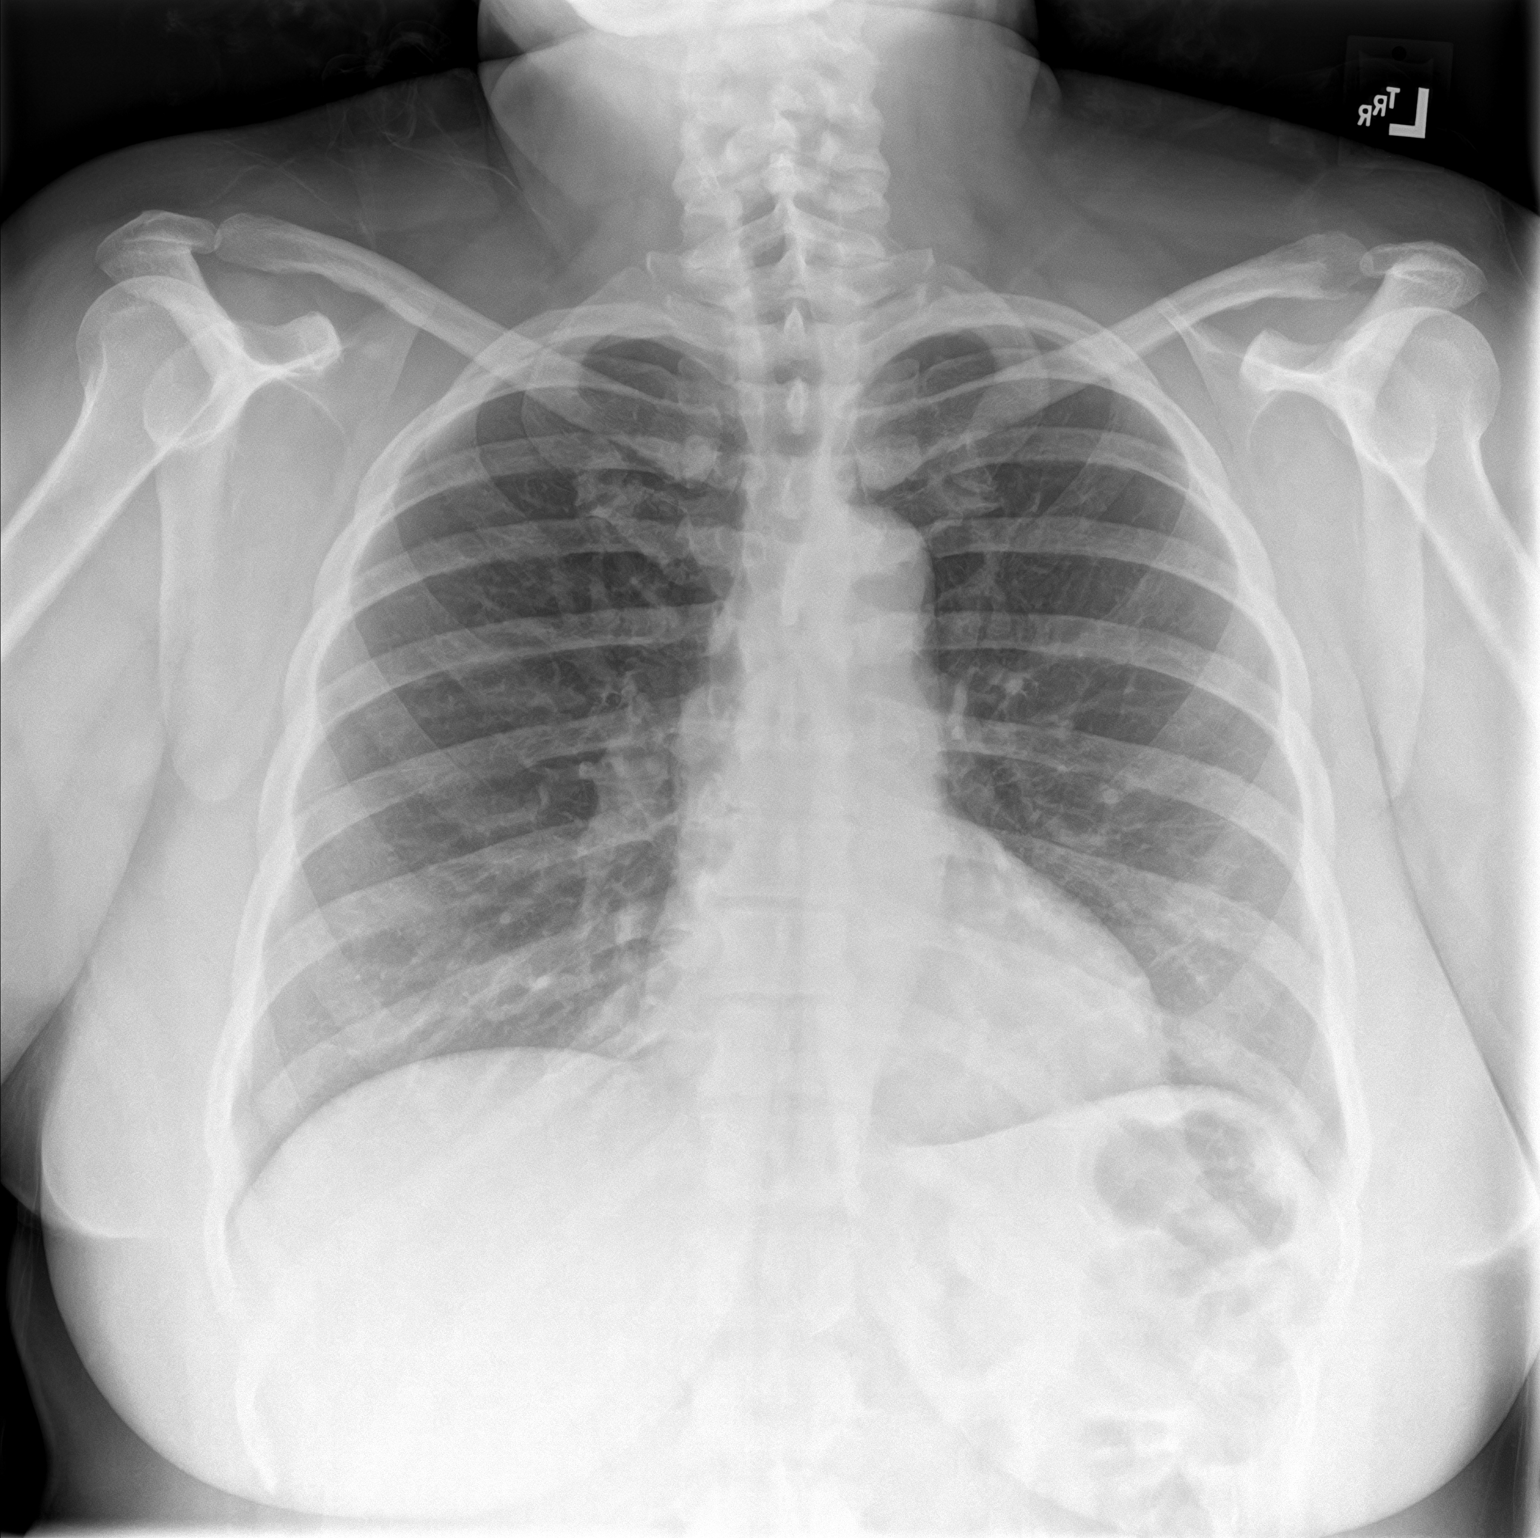
[im 2/2]
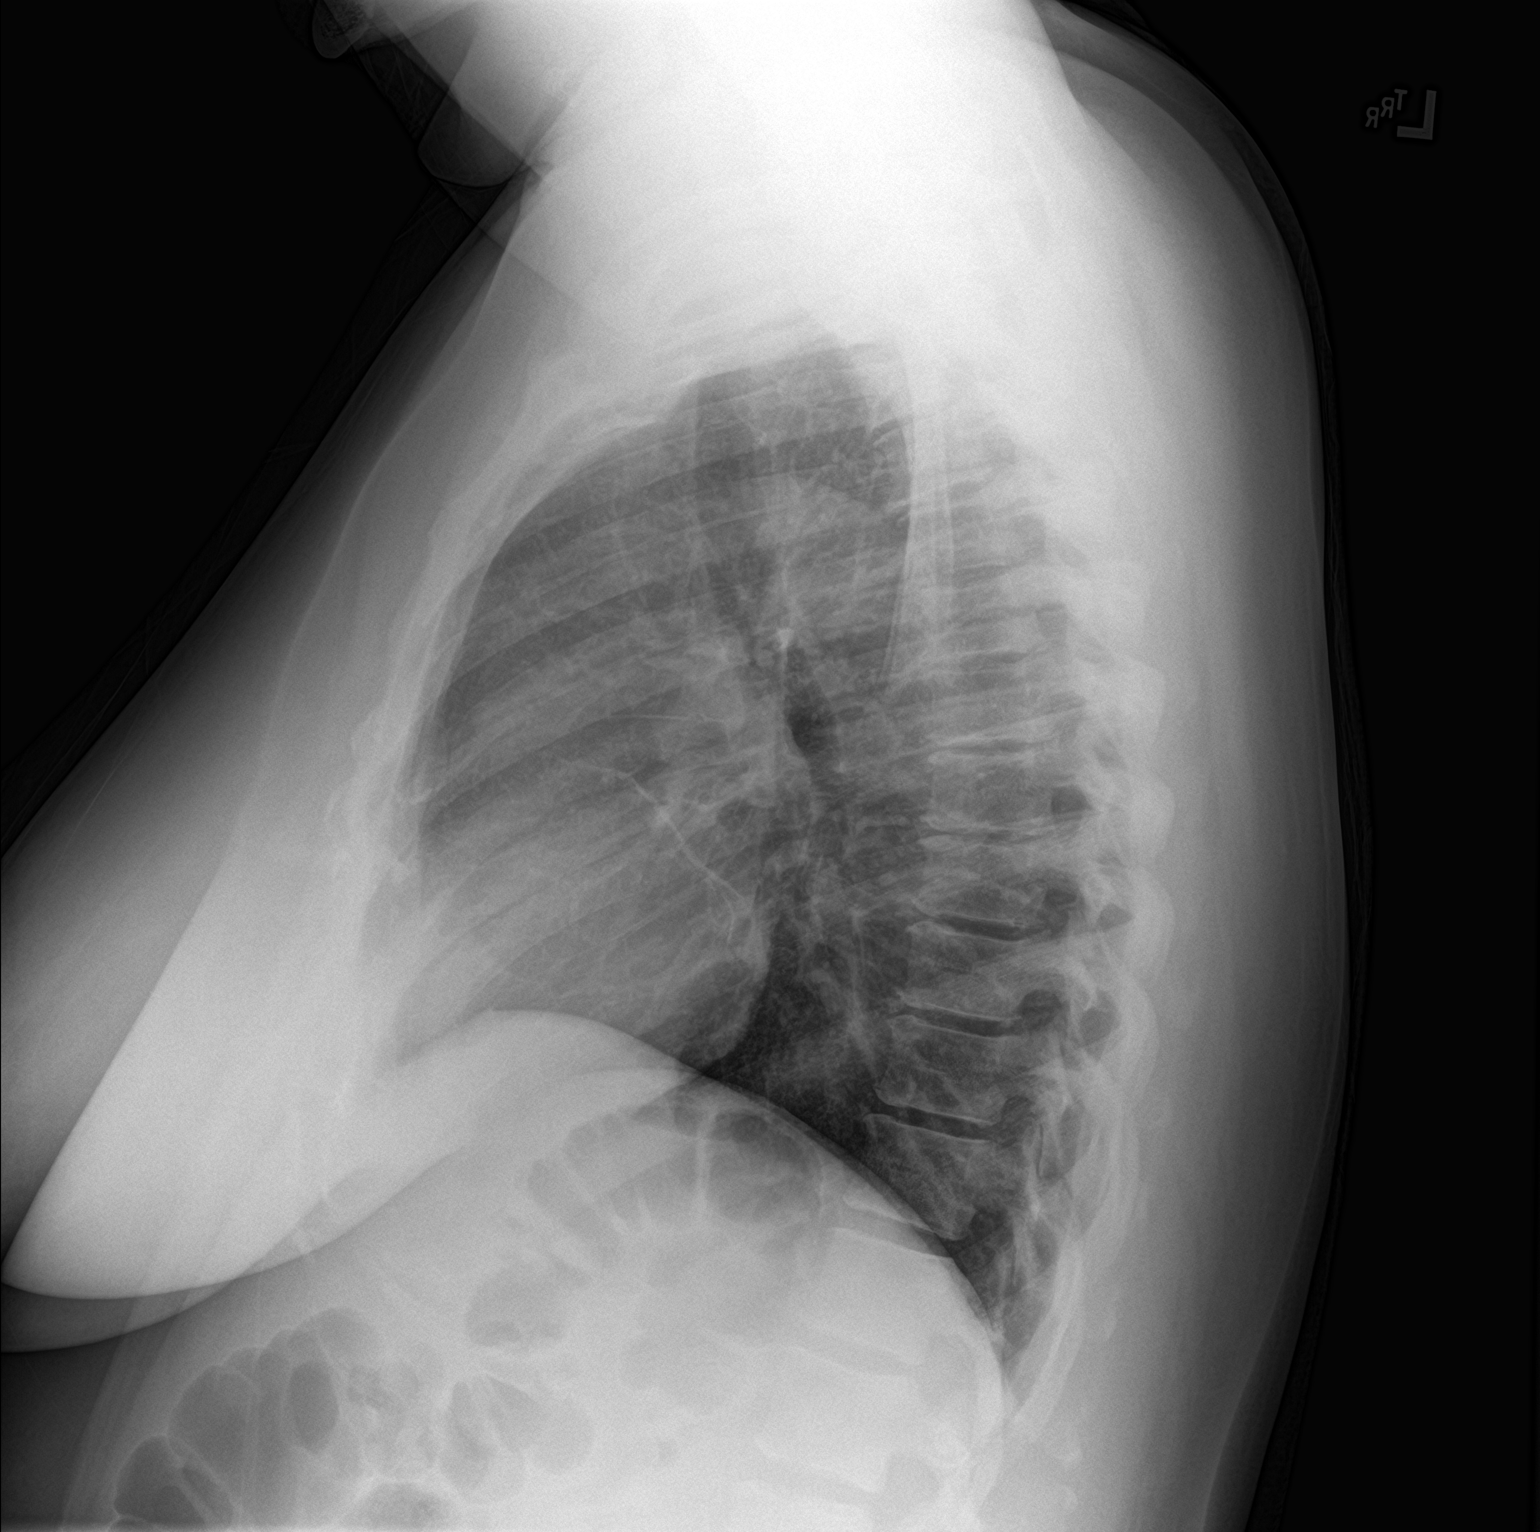

[2 of 2 positions shown; findings below may reference images not displayed]

FINDINGS: The heart size and mediastinal contours are within normal limits.
Both lungs are clear. The visualized skeletal structures are
unremarkable.
IMPRESSION: No active cardiopulmonary disease.

## 2022-08-10 ENCOUNTER — Other Ambulatory Visit: Payer: Self-pay | Admitting: Family Medicine

## 2022-08-10 DIAGNOSIS — Z1231 Encounter for screening mammogram for malignant neoplasm of breast: Secondary | ICD-10-CM

## 2022-08-28 ENCOUNTER — Inpatient Hospital Stay: Admission: RE | Admit: 2022-08-28 | Payer: Federal, State, Local not specified - PPO | Source: Ambulatory Visit

## 2023-01-17 ENCOUNTER — Inpatient Hospital Stay
Admission: RE | Admit: 2023-01-17 | Discharge: 2023-01-17 | Disposition: A | Discharge status: Home / Self Care | Payer: Self-pay | Source: Ambulatory Visit | Attending: *Deleted | Admitting: *Deleted

## 2023-01-17 ENCOUNTER — Encounter: Payer: Self-pay | Admitting: Family Medicine

## 2023-01-17 ENCOUNTER — Other Ambulatory Visit: Payer: Self-pay | Admitting: *Deleted

## 2023-01-17 DIAGNOSIS — Z1231 Encounter for screening mammogram for malignant neoplasm of breast: Secondary | ICD-10-CM

## 2023-01-21 ENCOUNTER — Inpatient Hospital Stay: Admission: RE | Admit: 2023-01-21 | Payer: Federal, State, Local not specified - PPO | Source: Ambulatory Visit

## 2023-01-22 ENCOUNTER — Other Ambulatory Visit: Payer: Self-pay | Admitting: Family Medicine

## 2023-01-22 DIAGNOSIS — N6001 Solitary cyst of right breast: Secondary | ICD-10-CM

## 2023-01-25 ENCOUNTER — Ambulatory Visit
Admission: RE | Admit: 2023-01-25 | Discharge: 2023-01-25 | Disposition: A | Discharge status: Home / Self Care | Payer: Federal, State, Local not specified - PPO | Source: Ambulatory Visit | Attending: Family Medicine | Admitting: Family Medicine

## 2023-01-25 DIAGNOSIS — N6001 Solitary cyst of right breast: Secondary | ICD-10-CM

## 2023-09-29 ENCOUNTER — Other Ambulatory Visit: Payer: Self-pay

## 2023-09-29 ENCOUNTER — Emergency Department
Admission: EM | Admit: 2023-09-29 | Discharge: 2023-09-29 | Disposition: A | Discharge status: Home / Self Care | Payer: Federal, State, Local not specified - PPO | Attending: Emergency Medicine | Admitting: Emergency Medicine

## 2023-09-29 ENCOUNTER — Emergency Department: Payer: Federal, State, Local not specified - PPO

## 2023-09-29 DIAGNOSIS — M25562 Pain in left knee: Secondary | ICD-10-CM | POA: Diagnosis present

## 2023-09-29 DIAGNOSIS — I10 Essential (primary) hypertension: Secondary | ICD-10-CM | POA: Diagnosis not present

## 2023-09-29 NOTE — Discharge Instructions (Addendum)
Your evaluated in the ED for knee pain.  Your x-ray is reassuring.  There is no broken bone or dislocation.  Please remain in knee brace for 1 week for comfort.  If symptoms do not improve follow-up with orthopedics.  Rest, apply ice to the affected area to help with swelling and keep knee elevated above heart level.  Alternate taking Tylenol and ibuprofen for pain as needed.

## 2023-09-29 NOTE — ED Provider Notes (Signed)
[facility] Emergency Department Provider Note     Event Date/Time   First MD Initiated Contact with Patient 09/29/23 1636     (approximate)   History   Knee Pain   HPI  Samantha Guerra is a 52 y.o. female with history of fibromyalgia, HTN and migraines presents to the ED following a fall onto her left knee.  Patient reports she was walking when her knee buckled and she fell.  She denies striking her head or LOC. Denies numbness and tingling.  Patient reports knee pain is worse with ambulation.    Physical Exam   Triage Vital Signs: ED Triage Vitals [09/29/23 1633]  Encounter Vitals Group     BP (!) 147/99     Systolic BP Percentile      Diastolic BP Percentile      Pulse Rate 80     Resp 20     Temp 98 F (36.7 C)     Temp Source Oral     SpO2 98 %     Weight 235 lb (106.6 kg)     Height 5\' 7"  (1.702 m)     Head Circumference      Peak Flow      Pain Score 8     Pain Loc      Pain Education      Exclude from Growth Chart     Most recent vital signs: Vitals:   09/29/23 1633  BP: (!) 147/99  Pulse: 80  Resp: 20  Temp: 98 F (36.7 C)  SpO2: 98%    General Awake, no distress.  HEENT NCAT. PERRL. EOMI. No rhinorrhea. Mucous membranes are moist.  CV:  Good peripheral perfusion.  RESP:  Normal effort.  ABD:  No distention.  Other:  Right knee reveals no visible deformity.  Tenderness over anterior patella.  Full range of motion without difficulty.    ED Results / Procedures / Treatments   Labs (all labs ordered are listed, but only abnormal results are displayed) Labs Reviewed - No data to display   RADIOLOGY  I personally viewed and evaluated these images as part of my medical decision making, as well as reviewing the written report by the radiologist.  ED Provider Interpretation: Left knee x-ray appears normal  DG Knee Complete 4 Views Left  Result Date: 09/29/2023 CLINICAL DATA:  Pain after fall EXAM: LEFT KNEE -  COMPLETE 4 VIEW COMPARISON:  11/03/2019 FINDINGS: No fracture or dislocation. Preserved joint spaces. No joint effusion on lateral view. Small osteophytes seen of the patellofemoral joint. Preserved bone mineralization. Hyperostosis along the patella and proximal tibia. IMPRESSION: No acute osseous abnormality.  Chronic changes. Electronically Signed   By: Karen Kays M.D.   On: 09/29/2023 17:27    PROCEDURES:  Critical Care performed: No  Procedures   MEDICATIONS ORDERED IN ED: Medications - No data to display   IMPRESSION / MDM / ASSESSMENT AND PLAN / ED COURSE  I reviewed the triage vital signs and the nursing notes.                               52 y.o. female presents to the emergency department for evaluation and treatment of acute left knee injury. See HPI for further details.   Differential diagnosis includes, but is not limited to fracture, dislocation, ligamentous injury  Patient's presentation is most consistent with acute complicated illness / injury  requiring diagnostic workup.  Patient's presentation is consistent with a left knee contusion.  X-rays are reassuring.  Patient is placed in a knee brace and she is encouraged to maintain in this brace for comfort and stability.  Encouraged follow-up with orthopedic if symptoms do not improve. Weightbearing as tolerated.  Patient verbalized understanding.  Patient will be discharged home in stable condition.  ED precautions discussed.   FINAL CLINICAL IMPRESSION(S) / ED DIAGNOSES   Final diagnoses:  Acute pain of left knee   Rx / DC Orders   ED Discharge Orders     None      Note:  This document was prepared using Dragon voice recognition software and may include unintentional dictation errors.    Romeo Apple, Lionardo Haze A, PA-C 09/29/23 2138    Minna Antis, MD 10/01/23 1445

## 2023-09-29 NOTE — ED Triage Notes (Signed)
Pt to ED via POV from home. Pt reports was walking and her left knee buckled. Pt reports left knee and left thigh pain. Pt reports able to bear minimal weight on left leg.

## 2024-01-15 ENCOUNTER — Other Ambulatory Visit: Payer: Self-pay | Admitting: Family Medicine

## 2024-01-15 DIAGNOSIS — Z1231 Encounter for screening mammogram for malignant neoplasm of breast: Secondary | ICD-10-CM

## 2024-01-31 ENCOUNTER — Ambulatory Visit
Admission: RE | Admit: 2024-01-31 | Discharge: 2024-01-31 | Disposition: A | Discharge status: Home / Self Care | Source: Ambulatory Visit | Attending: Family Medicine | Admitting: Family Medicine

## 2024-01-31 DIAGNOSIS — Z1231 Encounter for screening mammogram for malignant neoplasm of breast: Secondary | ICD-10-CM | POA: Diagnosis present

## 2024-02-25 ENCOUNTER — Telehealth: Payer: Self-pay | Admitting: Acute Care

## 2024-02-25 DIAGNOSIS — Z122 Encounter for screening for malignant neoplasm of respiratory organs: Secondary | ICD-10-CM

## 2024-02-25 DIAGNOSIS — F17210 Nicotine dependence, cigarettes, uncomplicated: Secondary | ICD-10-CM

## 2024-02-25 DIAGNOSIS — Z87891 Personal history of nicotine dependence: Secondary | ICD-10-CM

## 2024-02-25 NOTE — Telephone Encounter (Signed)
 Lung Cancer Screening Narrative/Criteria Questionnaire (Cigarette Smokers Only- No Cigars/Pipes/vapes)   [name]   SDMV:03/16/2024 9:15a Samantha Guerra       [birth year]   LDCT: 03/17/2024 1:30 MCM   52 y.o.   Phone: [phone]  Lung Screening Narrative (confirm age 45-77 yrs Medicare / 50-80 yrs Private pay insurance)   Solicitor and bcbs   Referring Provider:Robyn Rachel Budds   This screening involves an initial phone call with a team member from our program. It is called a shared decision making visit. The initial meeting is required by  insurance and Medicare to make sure you understand the program. This appointment takes about 15-20 minutes to complete. You will complete the screening scan at your scheduled date/time.  This scan takes about 5-10 minutes to complete. You can eat and drink normally before and after the scan.  Criteria questions for Lung Cancer Screening:   Are you a current or former smoker? Current Age began smoking: 53yo   If you are a former smoker, what year did you quit smoking? N/A(within 15 yrs)   To calculate your smoking history, I need an accurate estimate of how many packs of cigarettes you smoked per day and for how many years. (Not just the number of PPD you are now smoking)   Years smoking 31 x Packs per day 1 = Pack years 31   (at least 20 pack yrs)   (Make sure they understand that we need to know how much they have smoked in the past, not just the number of PPD they are smoking now)  Do you have a personal history of cancer?  No    Do you have a family history of cancer? Yes  (cancer type and and relative) mother - lung, father -  lung   Are you coughing up blood?  No  Have you had unexplained weight loss of 15 lbs or more in the last 6 months? No  It looks like you meet all criteria.  When would be a good time for us  to schedule you for this screening?   Additional information: N/A

## 2024-03-12 ENCOUNTER — Emergency Department

## 2024-03-12 ENCOUNTER — Emergency Department
Admission: EM | Admit: 2024-03-12 | Discharge: 2024-03-12 | Disposition: A | Discharge status: Home / Self Care | Attending: Emergency Medicine | Admitting: Emergency Medicine

## 2024-03-12 ENCOUNTER — Other Ambulatory Visit: Payer: Self-pay

## 2024-03-12 DIAGNOSIS — R0789 Other chest pain: Secondary | ICD-10-CM | POA: Diagnosis present

## 2024-03-12 DIAGNOSIS — R42 Dizziness and giddiness: Secondary | ICD-10-CM | POA: Insufficient documentation

## 2024-03-12 DIAGNOSIS — R55 Syncope and collapse: Secondary | ICD-10-CM | POA: Insufficient documentation

## 2024-03-12 DIAGNOSIS — I10 Essential (primary) hypertension: Secondary | ICD-10-CM | POA: Diagnosis not present

## 2024-03-12 DIAGNOSIS — R002 Palpitations: Secondary | ICD-10-CM | POA: Insufficient documentation

## 2024-03-12 HISTORY — DX: Transient cerebral ischemic attack, unspecified: G45.9

## 2024-03-12 LAB — CBC
HCT: 45.3 % (ref 36.0–46.0)
Hemoglobin: 14.9 g/dL (ref 12.0–15.0)
MCH: 29.7 pg (ref 26.0–34.0)
MCHC: 32.9 g/dL (ref 30.0–36.0)
MCV: 90.2 fL (ref 80.0–100.0)
Platelets: 228 10*3/uL (ref 150–400)
RBC: 5.02 MIL/uL (ref 3.87–5.11)
RDW: 13.3 % (ref 11.5–15.5)
WBC: 12.1 10*3/uL — ABNORMAL HIGH (ref 4.0–10.5)
nRBC: 0 % (ref 0.0–0.2)

## 2024-03-12 LAB — BASIC METABOLIC PANEL WITH GFR
Anion gap: 10 (ref 5–15)
BUN: 20 mg/dL (ref 6–20)
CO2: 22 mmol/L (ref 22–32)
Calcium: 9.5 mg/dL (ref 8.9–10.3)
Chloride: 108 mmol/L (ref 98–111)
Creatinine, Ser: 0.92 mg/dL (ref 0.44–1.00)
GFR, Estimated: >60 mL/min (ref >60)
Glucose, Bld: 99 mg/dL (ref 70–99)
Potassium: 4.6 mmol/L (ref 3.5–5.1)
Sodium: 140 mmol/L (ref 135–145)

## 2024-03-12 LAB — TROPONIN I (HIGH SENSITIVITY)
Troponin I (High Sensitivity): 4 ng/L (ref <18)
Troponin I (High Sensitivity): 4 ng/L (ref <18)

## 2024-03-12 NOTE — ED Triage Notes (Signed)
 Patient reports chest pain that started about one hour ago while at work.

## 2024-03-12 NOTE — ED Provider Notes (Signed)
 [facility] Provider Note    Event Date/Time   First MD Initiated Contact with Patient 03/12/24 1820     (approximate)   History   Chest Pain   HPI  Samantha Guerra is a 53 y.o. female with a history of hypertension, migraines, and fibromyalgia who presents with lightheadedness and chest discomfort, acute onset a couple of hours ago, now mostly resolved.  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  She states that is often quite hot.  She got hot and felt like the air circulation was not good.  She started to feel very sweaty and then got lightheaded.  She did not pass out.  She then felt some palpitations and some tightness in her chest but no actual chest pain.  She states that she still has very mild palpitations but the other symptoms have completely resolved.  She denies any leg swelling.  She has no cough or fever.  I reviewed the past medical records.  The patient's most recent prior encounter was at [facility] on 4/7 in the ED for acute knee pain.   Physical Exam   Triage Vital Signs: ED Triage Vitals  Encounter Vitals Group     BP 03/12/24 1711 (!) 157/97     Systolic BP Percentile --      Diastolic BP Percentile --      Pulse Rate 03/12/24 1711 91     Resp 03/12/24 1711 17     Temp 03/12/24 1711 99 F (37.2 C)     Temp Source 03/12/24 1711 Oral     SpO2 03/12/24 1711 97 %     Weight 03/12/24 1708 244 lb (110.7 kg)     Height 03/12/24 1708 5\' 7"  (1.702 m)     Head Circumference --      Peak Flow --      Pain Score 03/12/24 1708 5     Pain Loc --      Pain Education --      Exclude from Growth Chart --     Most recent vital signs: Vitals:   03/12/24 1711  BP: (!) 157/97  Pulse: 91  Resp: 17  Temp: 99 F (37.2 C)  SpO2: 97%    General:  Alert, well-appearing, no distress.  CV:  Good peripheral perfusion.  Normal heart  sounds. Resp:  Normal effort.  Lungs CTAB. Abd:  No distention.  Other:  No peripheral edema.   ED Results / Procedures / Treatments   Labs (all labs ordered are listed, but only abnormal results are displayed) Labs Reviewed  CBC - Abnormal; Notable for the following components:      Result Value   WBC 12.1 (*)    All other components within normal limits  BASIC METABOLIC PANEL WITH GFR  TROPONIN I (HIGH SENSITIVITY)  TROPONIN I (HIGH SENSITIVITY)     EKG  ED ECG REPORT I, Lind Repine, the attending physician, personally viewed and interpreted this ECG.  Date: 03/12/2024 EKG Time: 1709 Rate: 89 Rhythm: normal sinus rhythm QRS Axis: normal Intervals: normal ST/T Wave abnormalities: normal Narrative Interpretation: no evidence of acute ischemia    RADIOLOGY  Chest x-ray: I independently viewed and interpreted the images; there is no focal consolidation or edema  PROCEDURES:  Critical Care performed: No  Procedures   MEDICATIONS ORDERED  IN ED: Medications - No data to display   IMPRESSION / MDM / ASSESSMENT AND PLAN / ED COURSE  I reviewed the triage vital signs and the nursing notes.  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  The symptoms have now mostly resolved.  Physical exam is unremarkable for acute findings.  The patient is hypertensive with otherwise normal vital signs.  She is overall well-appearing.  There is no edema.  EKG is nonischemic.  Chest x-ray is clear.  Differential diagnosis includes, but is not limited to, vasovagal episode, dehydration/hypovolemia, heat exhaustion, less likely cardiac dysrhythmia or ACS.  There is no evidence of PE, aortic dissection, or other vascular cause given that the symptoms have resolved.  Initial troponin is negative.  BMP and CBC show no acute findings.  We will  obtain a repeat troponin and reassess.  Patient's presentation is most consistent with acute complicated illness / injury requiring diagnostic workup.  The patient is on the cardiac monitor to evaluate for evidence of arrhythmia and/or significant heart rate changes.   ----------------------------------------- 8:23 PM on 03/12/2024 -----------------------------------------  Repeat troponin is negative.  The patient remains asymptomatic.  She is stable for discharge home at this time.  Return precautions given, she expresses understanding.  FINAL CLINICAL IMPRESSION(S) / ED DIAGNOSES   Final diagnoses:  Near syncope  Chest discomfort     Rx / DC Orders   ED Discharge Orders     None        Note:  This document was prepared using Dragon voice recognition software and may include unintentional dictation errors.    Lind Repine, MD 03/12/24 2023

## 2024-03-12 NOTE — Discharge Instructions (Signed)
 Return to the ER for new, worsening, or persistent severe lightheadedness, weakness, feel like you are going to pass out, chest discomfort or pain, palpitations, or any other new or worsening symptoms that concern you.

## 2024-03-16 ENCOUNTER — Encounter: Payer: Self-pay | Admitting: Adult Health

## 2024-03-16 ENCOUNTER — Ambulatory Visit: Admitting: Adult Health

## 2024-03-16 DIAGNOSIS — Z87891 Personal history of nicotine dependence: Secondary | ICD-10-CM | POA: Diagnosis not present

## 2024-03-16 NOTE — Patient Instructions (Signed)

## 2024-03-16 NOTE — Progress Notes (Signed)
  Virtual Visit via Telephone Note  I connected with Samantha Guerra , 03/16/24 9:26 AM by a telemedicine application and verified that I am speaking with the correct person using two identifiers.  Location: Patient: home Provider: home   I discussed the limitations of evaluation and management by telemedicine and the availability of in person appointments. The patient expressed understanding and agreed to proceed.   Shared Decision Making Visit Lung Cancer Screening Program [phone])   Eligibility: 53 y.o. Pack Years Smoking History Calculation = 31 pack years  (# packs/per year x # years smoked) Recent History of coughing up blood  no Unexplained weight loss? no ( >Than 15 pounds within the last 6 months ) Prior History Lung / other cancer no (Diagnosis within the last 5 years already requiring surveillance chest CT Scans). Smoking Status Former Smoker Former Smokers: Years since quit: < 1 year  Quit Date: 01/2024  Visit Components: Discussion included one or more decision making aids. YES Discussion included risk/benefits of screening. YES Discussion included potential follow up diagnostic testing for abnormal scans. YES Discussion included meaning and risk of over diagnosis. YES Discussion included meaning and risk of False Positives. YES Discussion included meaning of total radiation exposure. YES  Counseling Included: Importance of adherence to annual lung cancer LDCT screening. YES Impact of comorbidities on ability to participate in the program. YES Ability and willingness to under diagnostic treatment. YES  Smoking Cessation Counseling: Former Smokers:  Discussed the importance of maintaining cigarette abstinence. yes Diagnosis Code: Personal History of Nicotine Dependence. Y86.578 Information about tobacco cessation classes and interventions provided to patient. Yes Patient provided with "ticket" for LDCT Scan. yes Written Order for Lung Cancer Screening with LDCT  placed in Epic. Yes (CT Chest Lung Cancer Screening Low Dose W/O CM) [id number]  Z12.2-Screening of respiratory organs Z87.891-Personal history of nicotine dependence   [place] Dose 03/16/24

## 2024-03-17 ENCOUNTER — Ambulatory Visit
Admission: RE | Admit: 2024-03-17 | Discharge: 2024-03-17 | Disposition: A | Discharge status: Home / Self Care | Source: Ambulatory Visit | Attending: Acute Care | Admitting: Acute Care

## 2024-03-17 DIAGNOSIS — Z87891 Personal history of nicotine dependence: Secondary | ICD-10-CM

## 2024-03-17 DIAGNOSIS — F17210 Nicotine dependence, cigarettes, uncomplicated: Secondary | ICD-10-CM

## 2024-03-17 DIAGNOSIS — Z122 Encounter for screening for malignant neoplasm of respiratory organs: Secondary | ICD-10-CM

## 2024-05-12 ENCOUNTER — Encounter: Payer: Self-pay | Admitting: *Deleted

## 2024-05-13 ENCOUNTER — Encounter: Payer: Self-pay | Admitting: Internal Medicine

## 2024-05-13 ENCOUNTER — Ambulatory Visit
Admission: RE | Admit: 2024-05-13 | Discharge: 2024-05-13 | Disposition: A | Discharge status: Home / Self Care | Source: Ambulatory Visit | Attending: Nurse Practitioner | Admitting: Nurse Practitioner

## 2024-05-13 ENCOUNTER — Ambulatory Visit: Attending: Internal Medicine | Admitting: Internal Medicine

## 2024-05-13 ENCOUNTER — Other Ambulatory Visit: Payer: Self-pay | Admitting: Nurse Practitioner

## 2024-05-13 ENCOUNTER — Ambulatory Visit
Admission: RE | Admit: 2024-05-13 | Discharge: 2024-05-13 | Disposition: A | Discharge status: Home / Self Care | Attending: Nurse Practitioner | Admitting: Nurse Practitioner

## 2024-05-13 VITALS — BP 139/86 | HR 83 | Ht 67.0 in | Wt 260.0 lb

## 2024-05-13 DIAGNOSIS — I10 Essential (primary) hypertension: Secondary | ICD-10-CM | POA: Insufficient documentation

## 2024-05-13 DIAGNOSIS — R0609 Other forms of dyspnea: Secondary | ICD-10-CM | POA: Insufficient documentation

## 2024-05-13 DIAGNOSIS — R0602 Shortness of breath: Secondary | ICD-10-CM

## 2024-05-13 DIAGNOSIS — R079 Chest pain, unspecified: Secondary | ICD-10-CM | POA: Insufficient documentation

## 2024-05-13 DIAGNOSIS — Z79899 Other long term (current) drug therapy: Secondary | ICD-10-CM

## 2024-05-13 MED ORDER — HYDROCHLOROTHIAZIDE 12.5 MG PO CAPS
12.5000 mg | ORAL_CAPSULE | Freq: Every day | ORAL | 3 refills | Status: AC
Start: 2024-05-13 — End: 2024-08-11

## 2024-05-13 NOTE — Progress Notes (Signed)
 Cardiology Office Note:  .   Date:  05/13/2024  ID:  Samantha Guerra, DOB 1971-08-20, MRN [id number] PCP: [facility]  [facility] Providers Cardiologist:  None     History of Present Illness: .   Samantha Guerra is a 53 y.o. female with history of hypertension, TIA, and prediabetes, who has been referred by Samantha Guerra for evaluation of shortness of breath.  Samantha Guerra reports that she has experienced intermittent chest discomfort and dyspnea with strenuous activities over the last year.  This is most pronounced when she jogs or power walks.  She also notices some shortness of breath when she walks for extended distances at a modest pace.  She describes the chest discomfort as a dull ache that sometimes there is also a pressure-like component.  It seems to improve when she drinks something and belches.  Overall, she had been feeling better after she quit smoking for 2 months, though she started back about a week ago.  Her last episode of chest discomfort occurred yesterday.  She was evaluated in the ED in May for an episode of lightheadedness and chest pain, with high-sensitivity troponin I being negative x 2 at that time.  She recently saw her PCP, who performed an EKG and was concerned about some abnormalities suggesting enlarged heart.  Samantha Guerra notes that several family members, including her brother who follows with me in the clinic, have heart failure.  She notes some dependent leg edema, particularly when walking on concrete for much of the day.  She denies orthopnea but notes that she sometimes has to cough when lying completely flat.  She wonders if this could be related to GERD.  She was on omeprazole  at 1 point, though this has been stopped.  She notes that her blood pressure has been somewhat high for a while.  She was on HCTZ at 1 point and felt like this was working okay for her, though her blood pressures remain suboptimally controlled due to inconsistent  compliance.  She is now compliant with amlodipine.  ROS: See HPI  Studies Reviewed: SABRA   EKG Interpretation Date/Time:  Wednesday May 13 2024 09:08:00 EDT Ventricular Rate:  83 PR Interval:  156 QRS Duration:  82 QT Interval:  372 QTC Calculation: 437 R Axis:   -19  Text Interpretation: Normal sinus rhythm Possible Left atrial enlargement Minimal voltage criteria for LVH, may be normal variant ( R in aVL ) Nonspecific ST abnormality Abnormal ECG When compared with ECG of 12-Mar-2024 17:09, Nonspecific ST abnormality is now Present Criteria for Possible Anterior infarct are no longer Present Confirmed by Paxtyn Boyar, Lonni [phone]) on 05/13/2024 9:16:48 AM    Risk Assessment/Calculations:         Physical Exam:   VS:  BP 139/86   Pulse 83   Ht 5' 7 (1.702 m)   Wt 260 lb (117.9 kg)   SpO2 98%   BMI 40.72 kg/m    Wt Readings from Last 3 Encounters:  05/13/24 260 lb (117.9 kg)  03/12/24 244 lb (110.7 kg)  09/29/23 235 lb (106.6 kg)   Orthostatic VS: Position Blood pressure (mmHg) Heart rate (bpm)  Lying 144/91 81  Sitting 137/92 86  Standing 132/89 88  Standing (3 minutes) 144/89 86   General:  NAD. Neck: No JVD or HJR. Lungs: Clear to auscultation bilaterally without wheezes or crackles. Heart: Regular rate and rhythm without murmurs, rubs, or gallops. Abdomen: Soft, nontender, nondistended. Extremities: Trace pretibial edema  bilaterally.  ASSESSMENT AND PLAN: .    Chest pain and dyspnea on exertion: Symptoms have been present for about a year and are primarily associated with strenuous activities.  Notably, belching seems to make the discomfort go away.  Samantha Guerra has several cardiac risk factors including hypertension, TIA, prediabetes, and morbid obesity.  We have agreed to obtain a transthoracic echocardiogram.  If LVEF is preserved and there are no significant valvular abnormalities, we will follow this with a coronary CTA.  Alternatively, if significant cardiomyopathy  is identified, catheterization would be preferred.  In the meantime, we will continue aspirin and amlodipine.  I will also add HCTZ for improved blood pressure control and gentle diuresis.  I advised Samantha Guerra to seek immediate medical attention in the ED if she has recurrent chest pain that does not resolve promptly with rest.  Hypertension: Blood pressure borderline elevated today.  We will continue amlodipine 5 mg daily and also add HCTZ 12.5 mg daily.  Will plan to repeat a BMP in 2 to 4 weeks.  Morbid obesity: BMI greater than 40.  Encourage weight loss through diet and exercise.    Dispo: Return to clinic in 2 months.  Signed, Lonni Hanson, MD

## 2024-05-13 NOTE — Patient Instructions (Signed)
 Medication Instructions:  Your physician recommends the following medication changes.  START TAKING: Hydrochlorothiazide  12.5 mg by mouth daily     *If you need a refill on your cardiac medications before your next appointment, please call your pharmacy*  Lab Work: Your provider would like for you to return in 2-3 weeks to have the following labs drawn: BMP.   Please go to [facility] [address] ([facility] [address], [place] [address] You do not need an appointment.  They are open from 8 am- 4:30 pm.  Lunch from 1:00 pm- 2:00 pm You will not need to be fasting.    Testing/Procedures: Your physician has requested that you have an echocardiogram. Echocardiography is a painless test that uses sound waves to create images of your heart. It provides your doctor with information about the size and shape of your heart and how well your heart's chambers and valves are working.   You may receive an ultrasound enhancing agent through an IV if needed to better visualize your heart during the echo. This procedure takes approximately one hour.  There are no restrictions for this procedure.  This will take place at [address] [facility] [address], [place] [address]  Please note: We ask at that you not bring children with you during ultrasound (echo/ vascular) testing. Due to room size and safety concerns, children are not allowed in the ultrasound rooms during exams. Our front office staff cannot provide observation of children in our lobby area while testing is being conducted. An adult accompanying a patient to their appointment will only be allowed in the ultrasound room at the discretion of the ultrasound technician under special circumstances. We apologize for any inconvenience.   Follow-Up: At [facility], you and your health needs are our priority.  As part of our continuing mission to provide you with exceptional heart care, our providers  are all part of one team.  This team includes your primary Cardiologist (physician) and Advanced Practice Providers or APPs (Physician Assistants and Nurse Practitioners) who all work together to provide you with the care you need, when you need it.  Your next appointment:   2 month(s)  Provider:   You may see Lonni Hanson, MD or one of the following Advanced Practice Providers on your designated Care Team:   Lonni Meager, NP Lesley Maffucci, PA-C Bernardino Bring, PA-C Cadence [place], PA-C Tylene Lunch, NP Barnie Hila, NP

## 2024-05-14 ENCOUNTER — Ambulatory Visit: Attending: Internal Medicine

## 2024-05-14 DIAGNOSIS — R0609 Other forms of dyspnea: Secondary | ICD-10-CM | POA: Diagnosis not present

## 2024-05-14 DIAGNOSIS — R079 Chest pain, unspecified: Secondary | ICD-10-CM

## 2024-05-14 LAB — ECHOCARDIOGRAM COMPLETE
AV Mean grad: 4 mmHg
AV Peak grad: 7.2 mmHg
Ao pk vel: 1.34 m/s
Area-P 1/2: 3.91 cm2
S' Lateral: 3.02 cm

## 2024-05-15 ENCOUNTER — Ambulatory Visit: Payer: Self-pay | Admitting: Internal Medicine

## 2024-07-15 ENCOUNTER — Ambulatory Visit: Attending: Internal Medicine | Admitting: Internal Medicine

## 2024-07-15 NOTE — Progress Notes (Deleted)
  Cardiology Office Note:  .   Date:  07/15/2024  ID:  Samantha Guerra, DOB 1971-07-17, MRN [id number] PCP: [facility]  [facility] Providers Cardiologist:  None { Click to update primary MD,subspecialty MD or APP then REFRESH:1}    History of Present Illness: .   Samantha Guerra is a 53 y.o. female with history of hypertension, prediabetes, and TIA, who returns for follow-up of shortness of breath and chest pain.  I met her in June, at which time she complained of dyspnea and discomfort in her chest when jogging or power walking.  Blood pressure was mildly elevated that visit as well with negative orthostatic vital signs.  We agreed to add HCTZ and obtain an echocardiogram, which showed grade 1 diastolic dysfunction but no other significant abnormalities.  Needs follow-up BMP  ROS: See HPI  Studies Reviewed: SABRA        TTE (05/14/2024): Normal LV size and wall thickness.  LVEF 60-65% with grade 1 diastolic dysfunction and normal wall motion.  Normal RV size and function.  Unable to assess PA pressure.  Normal biatrial size.  No pericardial effusion.  No significant valvular abnormality.  Normal CVP.  Risk Assessment/Calculations:   {Does this patient have ATRIAL FIBRILLATION?[phone]} No BP recorded.  {Refresh Note OR Click here to enter BP  :1}***       Physical Exam:   VS:  There were no vitals taken for this visit.   Wt Readings from Last 3 Encounters:  05/13/24 260 lb (117.9 kg)  03/12/24 244 lb (110.7 kg)  09/29/23 235 lb (106.6 kg)    General:  NAD. Neck: No JVD or HJR. Lungs: Clear to auscultation bilaterally without wheezes or crackles. Heart: Regular rate and rhythm without murmurs, rubs, or gallops. Abdomen: Soft, nontender, nondistended. Extremities: No lower extremity edema.  ASSESSMENT AND PLAN: .    ***    {Are you ordering a CV Procedure (e.g. stress test, cath, DCCV, TEE, etc)?   Press F2        :[id number]}  Dispo:  ***  Signed, Lonni Hanson, MD

## 2024-12-24 ENCOUNTER — Ambulatory Visit: Admitting: Internal Medicine
